# Patient Record
Sex: Male | Born: 1965 | Race: White | Hispanic: No | Marital: Married | Smoking: Former smoker
Health system: Southern US, Community
[De-identification: ages and names within clinical notes are randomized; demographics above are authoritative.]

## PROBLEM LIST (undated history)

## (undated) DIAGNOSIS — B079 Viral wart, unspecified: Secondary | ICD-10-CM

## (undated) DIAGNOSIS — H269 Unspecified cataract: Secondary | ICD-10-CM

## (undated) DIAGNOSIS — G43909 Migraine, unspecified, not intractable, without status migrainosus: Secondary | ICD-10-CM

## (undated) DIAGNOSIS — I493 Ventricular premature depolarization: Secondary | ICD-10-CM

## (undated) HISTORY — DX: Migraine, unspecified, not intractable, without status migrainosus: G43.909

## (undated) HISTORY — DX: Viral wart, unspecified: B07.9

## (undated) HISTORY — DX: Unspecified cataract: H26.9

## (undated) HISTORY — DX: Ventricular premature depolarization: I49.3

## (undated) HISTORY — PX: CATARACT EXTRACTION: SUR2

---

## 2005-08-28 ENCOUNTER — Encounter: Admission: RE | Admit: 2005-08-28 | Discharge: 2005-08-28 | Payer: Self-pay | Admitting: Family Medicine

## 2007-03-04 IMAGING — US US SCROTUM
1 series · 14 of 25 positions shown · non-contrast
Comparison: None.

CLINICAL DATA: Palpable abnormality in the right scrotum at physical
examination. Patient gives history of hydrocele in the past.

SCROTAL ULTRASOUND 08/28/2005:

[Series 1: us scrotum · 0.08mm/px · 14 of 44 slices shown]
[im 1/44]
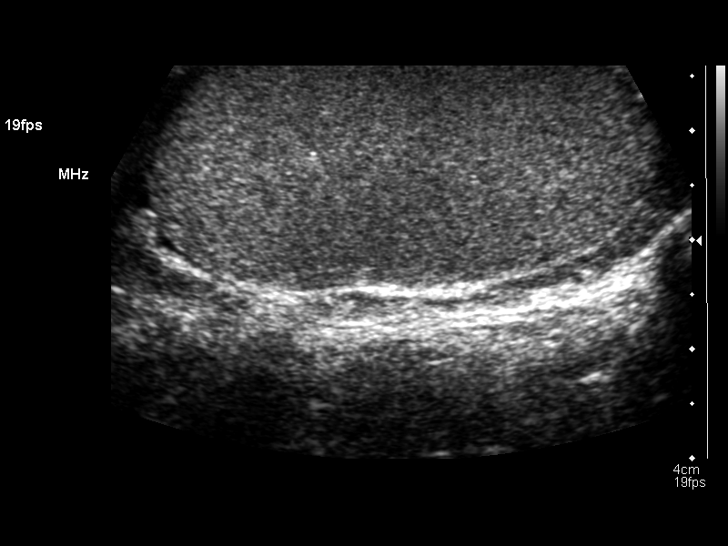
[im 4/44]
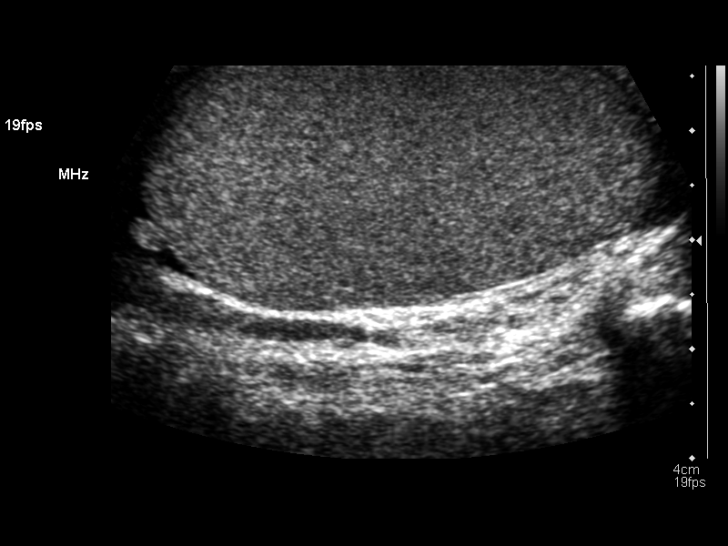
[im 8/44]
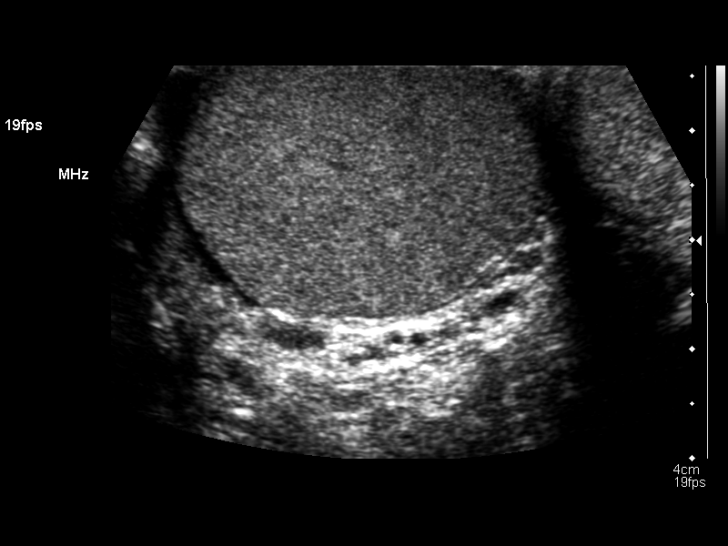
[im 11/44]
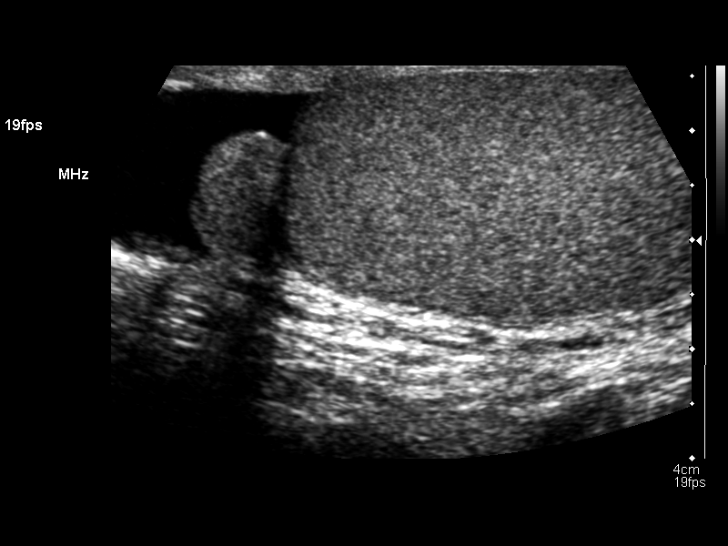
[im 15/44]
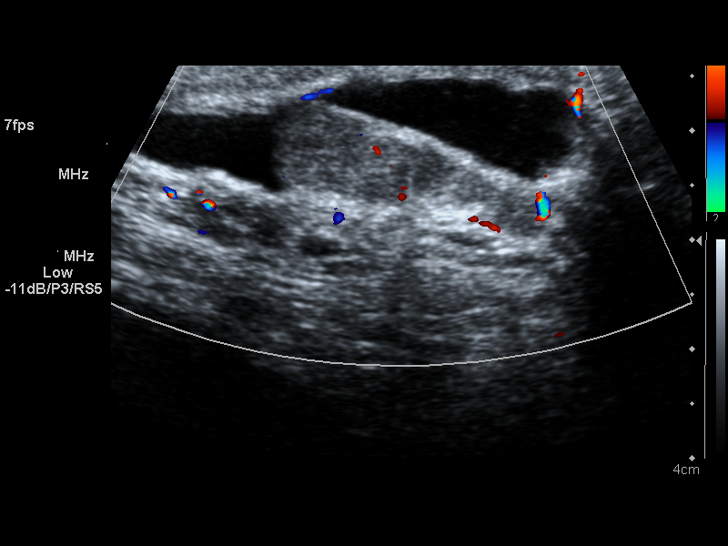
[im 17/44]
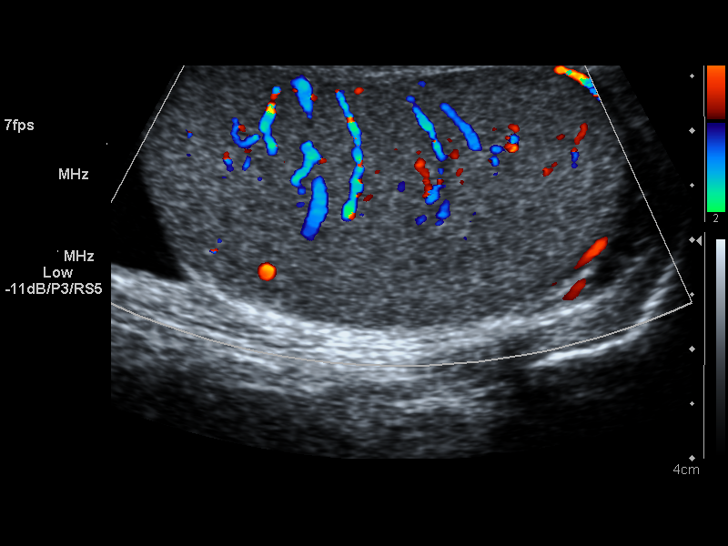
[im 20/44]
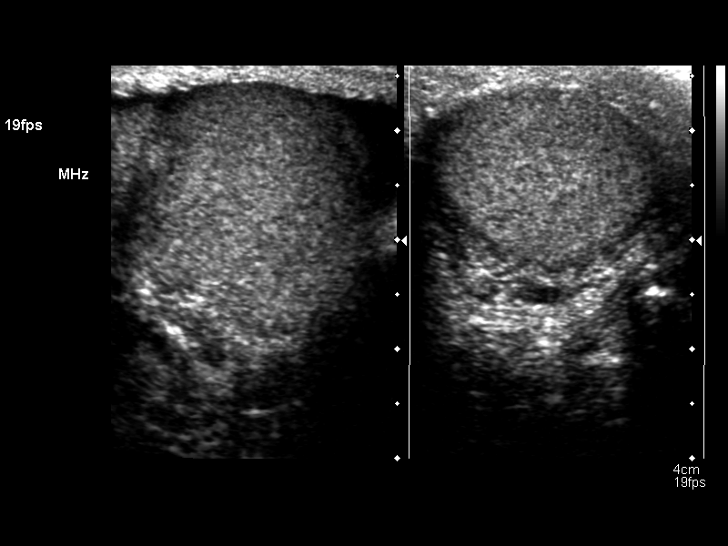
[im 24/44]
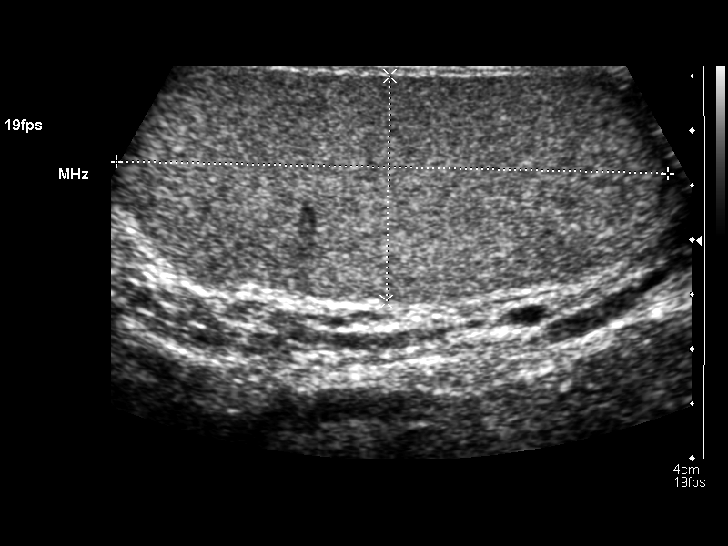
[im 27/44]
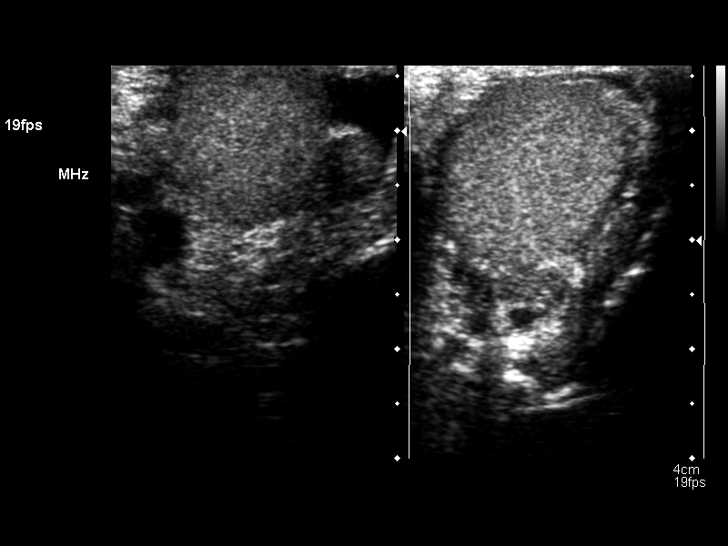
[im 29/44]
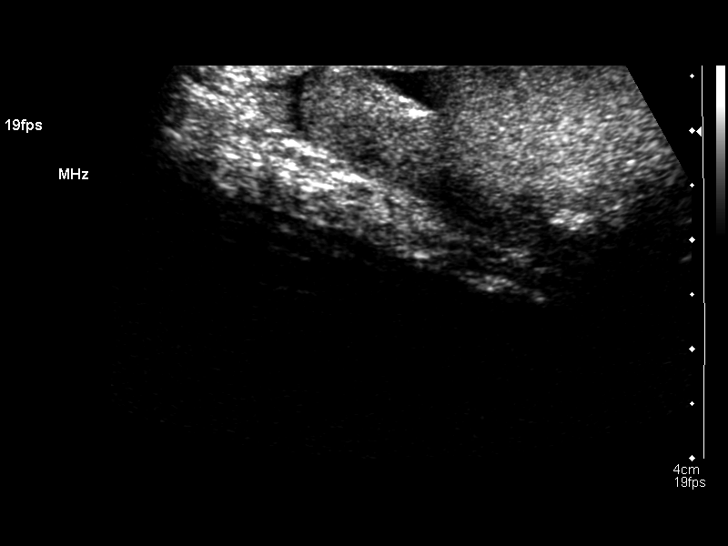
[im 33/44]
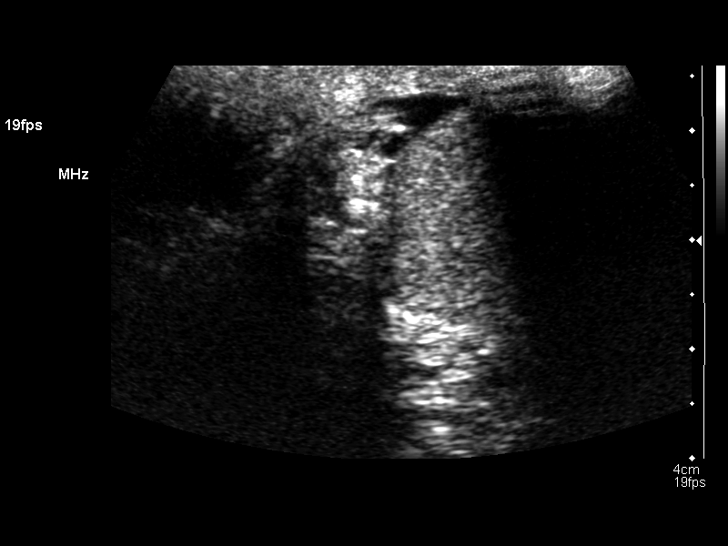
[im 36/44]
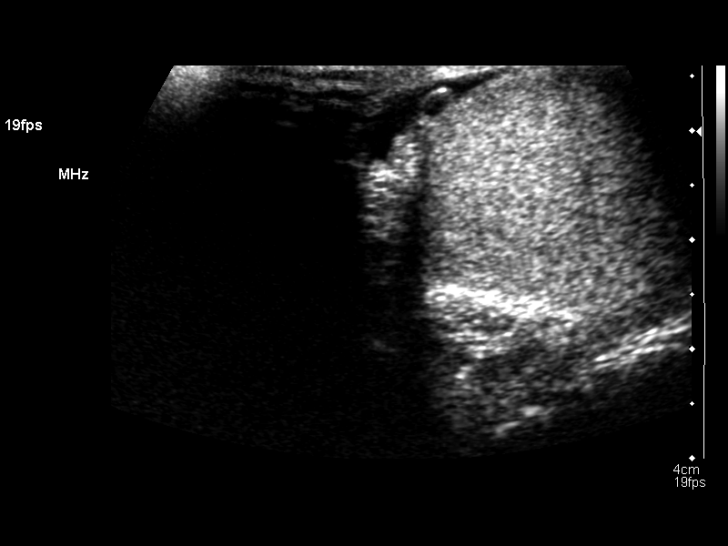
[im 40/44]
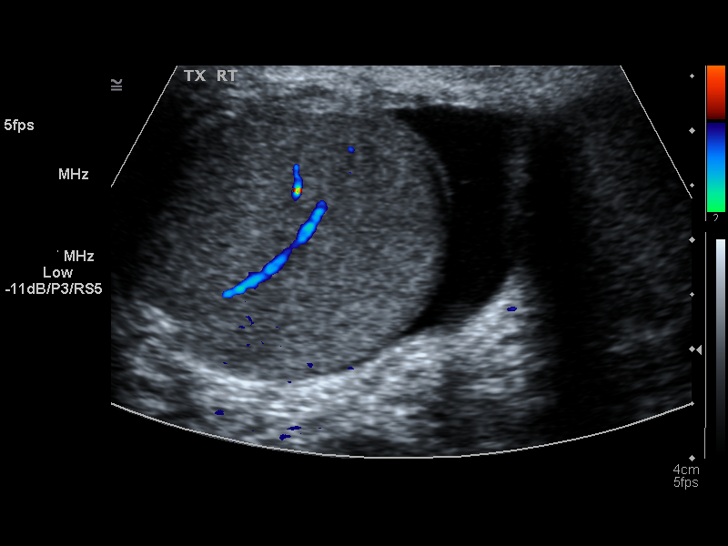
[im 44/44]
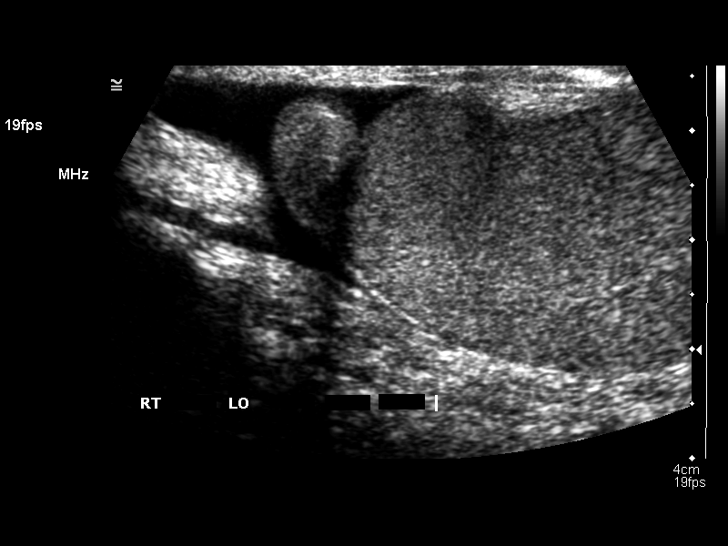

[14 of 25 positions shown; findings below may reference images not displayed]

FINDINGS: Right testicle:  Normal in size with homogeneous echotexture. No focal
abnormalities. 4.8 x 2.2 x 3.4 cm. Color Doppler flow confirmed.

Left testicle:  Normal in size with homogeneous echotexture. No focal
abnormalities. 5.2 x 2.2 x 2.7 cm. Color Doppler flow confirmed.

Right epididymis:  Normal.

Left epididymis:  Approximately 3 mm cyst. Normal otherwise.

Other:  There is a moderate right hydrocele a very small left hydrocele. No
varicoceles are identified. Imaging over the palpable area in the right scrotum
shows the normal epididymis within the hydrocele, and I suspected this is what
is being palpated.
IMPRESSION: 1. Normal appearing testes bilaterally.
2. 3 mm epididymal cyst on the left.
3. Moderate right hydrocele and small left hydrocele.
4. The palpable area in the right scrotum corresponds to the normal epididymis
within the right hydrocele.

## 2011-04-12 ENCOUNTER — Emergency Department (HOSPITAL_COMMUNITY)
Admission: EM | Admit: 2011-04-12 | Discharge: 2011-04-12 | Discharge status: Against Medical Advice | Payer: Self-pay | Attending: Emergency Medicine | Admitting: Emergency Medicine

## 2011-04-12 DIAGNOSIS — R6884 Jaw pain: Secondary | ICD-10-CM | POA: Insufficient documentation

## 2011-04-12 DIAGNOSIS — M25519 Pain in unspecified shoulder: Secondary | ICD-10-CM | POA: Insufficient documentation

## 2011-04-12 DIAGNOSIS — M542 Cervicalgia: Secondary | ICD-10-CM | POA: Insufficient documentation

## 2016-03-01 ENCOUNTER — Encounter: Payer: Self-pay | Admitting: Medical

## 2016-03-01 ENCOUNTER — Ambulatory Visit (INDEPENDENT_AMBULATORY_CARE_PROVIDER_SITE_OTHER): Payer: BLUE CROSS/BLUE SHIELD | Admitting: Medical

## 2016-03-01 VITALS — BP 114/64 | HR 63 | Ht 71.0 in | Wt 154.0 lb

## 2016-03-01 DIAGNOSIS — Z113 Encounter for screening for infections with a predominantly sexual mode of transmission: Secondary | ICD-10-CM

## 2016-03-01 DIAGNOSIS — L989 Disorder of the skin and subcutaneous tissue, unspecified: Secondary | ICD-10-CM | POA: Diagnosis not present

## 2016-03-01 DIAGNOSIS — Z299 Encounter for prophylactic measures, unspecified: Secondary | ICD-10-CM

## 2016-03-01 DIAGNOSIS — Z202 Contact with and (suspected) exposure to infections with a predominantly sexual mode of transmission: Secondary | ICD-10-CM | POA: Diagnosis not present

## 2016-03-01 DIAGNOSIS — Z418 Encounter for other procedures for purposes other than remedying health state: Secondary | ICD-10-CM | POA: Diagnosis not present

## 2016-03-01 LAB — BASIC METABOLIC PANEL
BUN: 25 mg/dL (ref 7–25)
CO2: 26 mmol/L (ref 20–31)
Calcium: 9.6 mg/dL (ref 8.6–10.3)
Chloride: 101 mmol/L (ref 98–110)
Creat: 0.84 mg/dL (ref 0.60–1.35)
Glucose, Bld: 89 mg/dL (ref 65–99)
Potassium: 4.7 mmol/L (ref 3.5–5.3)
Sodium: 139 mmol/L (ref 135–146)

## 2016-03-01 NOTE — Progress Notes (Signed)
Subjective Chief Complaint  Patient presents with  . New Patient (Initial Visit)    get established. wants to discuss truvada.    Here as a new patient.   No prior PCP.  Was using urgent care and [facility] prior.  Wanted to get established with PCP.   Quit smoking a year ago in April.  Starting a 12 week exercise program to work on strength and conditioning.  Has some moles he wants looked at.  A few new ones, and many unchanged for years.     Wants to talk about Truvada.  Been in relationship 24 years with his male partner, got married to him last year.   They both have open relationships with other men.  2 of their friends have HIV but HIV levels undetectable.   They use condoms.  However, they both want to get on Prep to help prevent transmission.  Has a friend in [place] who is a sex worker that encouraged him to get on Truvada.  He has no current symptoms    History reviewed. No pertinent past medical history.  ROS as in subjective  Objective: BP 114/64 mmHg  Pulse 63  Ht 5\' 11"  (1.803 m)  Wt 154 lb (69.854 kg)  BMI 21.49 kg/m2  General appearance: alert, no distress, WD/WN Left preauricular area with roundish flat brown lesion, likely solar lentigo, left hand over dorsal 2nd metacarpal with 3mm round rough lesion, possible AK vs wart, several other raised papule 2-353mm roundish lesions of both hands, several scattered macules in general, freckles throughout   Assessment: Encounter Diagnoses  Name Primary?  . Venereal disease contact Yes  . Need for prophylactic measure   . Screen for STD (sexually transmitted disease)   . Skin lesion     Plan: Discussed Truvada/PreP, proper use, safe sex, condom use, high risk behavior, possible complications of Truvada, follow up required to have treatment.   Labs today, and if negative, begin Prep.  Must f/u within 6mo for routine f/u and labs.  He agrees with plan, signed the [employer] Truvada agreement form.  We reviewed the  [employer] pre treatment checklist.     Skin lesions - referral to dermatology for routine skin surveillance and eval of the areas of concern  Advised he return soon for physical, preventative recommendations  Renae Fickleaul was seen today for new patient (initial visit).  Diagnoses and all orders for this visit:  Venereal disease contact -     HIV antibody -     RPR -     GC/Chlamydia Probe Amp -     Hepatitis C antibody -     Hepatitis B surface antibody -     Hepatitis B surface antigen -     Basic metabolic panel  Need for prophylactic measure -     HIV antibody -     RPR -     GC/Chlamydia Probe Amp -     Hepatitis C antibody -     Hepatitis B surface antibody -     Hepatitis B surface antigen -     Basic metabolic panel  Screen for STD (sexually transmitted disease) -     HIV antibody -     RPR -     GC/Chlamydia Probe Amp -     Hepatitis C antibody -     Hepatitis B surface antibody -     Hepatitis B surface antigen -     Basic metabolic panel  Skin lesion -  Ambulatory referral to Dermatology

## 2016-03-02 ENCOUNTER — Other Ambulatory Visit: Payer: Self-pay | Admitting: Medical

## 2016-03-02 LAB — RPR

## 2016-03-02 LAB — GC/CHLAMYDIA PROBE AMP
CT Probe RNA: NOT DETECTED
GC PROBE AMP APTIMA: NOT DETECTED

## 2016-03-02 LAB — HEPATITIS C ANTIBODY: HCV Ab: NEGATIVE

## 2016-03-02 LAB — HIV ANTIBODY (ROUTINE TESTING W REFLEX): HIV 1&2 Ab, 4th Generation: NONREACTIVE

## 2016-03-02 LAB — HEPATITIS B SURFACE ANTIBODY, QUANTITATIVE: HEPATITIS B-POST: 0.1 m[IU]/mL

## 2016-03-02 LAB — HEPATITIS B SURFACE ANTIGEN: Hepatitis B Surface Ag: NEGATIVE

## 2016-03-02 MED ORDER — EMTRICITABINE-TENOFOVIR DF 200-300 MG PO TABS: 1.0000 | ORAL_TABLET | Freq: Every day | ORAL | Status: DC

## 2016-03-03 ENCOUNTER — Other Ambulatory Visit: Payer: Self-pay | Admitting: *Deleted

## 2016-03-03 MED ORDER — EMTRICITABINE-TENOFOVIR DF 200-300 MG PO TABS: 1.0000 | ORAL_TABLET | Freq: Every day | ORAL | Status: DC

## 2016-05-02 ENCOUNTER — Ambulatory Visit (INDEPENDENT_AMBULATORY_CARE_PROVIDER_SITE_OTHER): Payer: BLUE CROSS/BLUE SHIELD | Admitting: Medical

## 2016-05-02 ENCOUNTER — Encounter: Payer: Self-pay | Admitting: Medical

## 2016-05-02 VITALS — BP 120/80 | HR 68 | Wt 163.0 lb

## 2016-05-02 DIAGNOSIS — M659 Synovitis and tenosynovitis, unspecified: Secondary | ICD-10-CM | POA: Diagnosis not present

## 2016-05-02 DIAGNOSIS — IMO0002 Reserved for concepts with insufficient information to code with codable children: Secondary | ICD-10-CM

## 2016-05-02 NOTE — Progress Notes (Addendum)
Subjective: Chief Complaint  Patient presents with  . rt wrist pain    said that over the last 10 weeks it has been coming and going. flares up with certain movements. was hurting so bad yesterday he could not do activites and would "take his breath away"   He reports right wrist issues x last 10 weeks.  Sometimes gets sharp stabbing pains either in volar wrist or deep in elbow.   yesterday pulling up his pants, had sudden pain that made him gasps due the the right arm pain.  Is right handed.  Gets symptoms at least weekly.  Been in a 12 week training program, and has noticed this.   Denies injury, trauma.   He notes some aches in the right forearm in the past, but not ongoing problems quite like current symptoms.   On the computer a lot, is a Paediatric nursebarber as well.  No numbness, but gets some strange sensations in fingers, but attributes this to dog attack in 2011.  Using no medications, ice or other treatment for this.  Denies swelling of the wrist or fingers.  No other aggravating or relieving factors. No other complaint.  ROS as in subjective  Objective: BP 120/80 mmHg  Pulse 68  Wt 163 lb (73.936 kg)  Gen:wd, wn, nad Skin: unremarkable Right wrist and arm nontender to palpation, mild pain in volar wrist and anterior elbow region with flexion of forearm, no swelling, no deformity, but ROM of elbow wrist and fingers normal Arms neurovascularly intact   Assessment: Encounter Diagnosis  Name Primary?  . Tendonitis of elbow or forearm Yes    Plan: Discussed differential.   Begin OTC Aleve, ice 20 minutes BID for the next few days, begin OTC reinforced wrist splint, adequately rest the arm as discussed, cut back on the arm strength training for now.   If not resolved within 2 weeks call or recheck.  Once the symptom resolve, will need to start back more gradual with the exercise regimen he is doing.

## 2016-05-22 ENCOUNTER — Other Ambulatory Visit: Payer: Self-pay | Admitting: Medical

## 2016-05-22 NOTE — Telephone Encounter (Signed)
Is this ok to refill?  

## 2016-05-30 ENCOUNTER — Ambulatory Visit (INDEPENDENT_AMBULATORY_CARE_PROVIDER_SITE_OTHER): Payer: BLUE CROSS/BLUE SHIELD | Admitting: Medical

## 2016-05-30 ENCOUNTER — Encounter: Payer: Self-pay | Admitting: Medical

## 2016-05-30 VITALS — BP 112/70 | HR 65 | Wt 166.0 lb

## 2016-05-30 DIAGNOSIS — Z299 Encounter for prophylactic measures, unspecified: Secondary | ICD-10-CM | POA: Diagnosis not present

## 2016-05-30 DIAGNOSIS — Z113 Encounter for screening for infections with a predominantly sexual mode of transmission: Secondary | ICD-10-CM | POA: Diagnosis not present

## 2016-05-30 DIAGNOSIS — Z125 Encounter for screening for malignant neoplasm of prostate: Secondary | ICD-10-CM | POA: Insufficient documentation

## 2016-05-30 DIAGNOSIS — Z23 Encounter for immunization: Secondary | ICD-10-CM | POA: Diagnosis not present

## 2016-05-30 NOTE — Progress Notes (Signed)
Subjective:     Patient ID: Curtis Walsh, male   DOB: Apr 03, 1966, 50 y.o.   MRN: [id number]  HPI Chief Complaint  Patient presents with  . Follow-up    states all is well   Here for f/u on PreP.  Started Prep 60mo ago, compliant, taking it daily, had zero copay since [employer] covered copay.  Has no side effects or problems with the medication.  No new sexual partners.  Is married, male partner.  Uses condoms.  No new c/o.  Since last visit saw dermatology and they noted no worrisome findings, no biopsies taken.    Review of Systems     Objective:   Physical Exam  Gen: wd, wn, nad otherwise not examined     Assessment:     Encounter Diagnoses  Name Primary?  . Prophylactic measure Yes  . Screen for STD (sexually transmitted disease)   . Need for hepatitis B vaccination        Plan:     HIV lab today for PreP f/u.  C/t PreP/truvada prophylaxis, discussed safe sex, condom use, routine f/u.    Counseled on the Hepatitis B virus vaccine.  Vaccine information sheet given.  Hepatitis B vaccine given after consent obtained.  Patient was advised to return in 2 months for Hep B #2, and in 6 months for Hep B #3.    F/u 44mo on PreP and physical.

## 2016-05-31 ENCOUNTER — Telehealth: Payer: Self-pay | Admitting: Medical

## 2016-05-31 ENCOUNTER — Other Ambulatory Visit: Payer: Self-pay | Admitting: Medical

## 2016-05-31 LAB — HIV ANTIBODY (ROUTINE TESTING W REFLEX): HIV 1&2 Ab, 4th Generation: NONREACTIVE

## 2016-05-31 MED ORDER — EMTRICITABINE-TENOFOVIR DF 200-300 MG PO TABS: 1.0000 | ORAL_TABLET | Freq: Every day | ORAL | 0 refills | Status: DC

## 2016-05-31 NOTE — Telephone Encounter (Signed)
That is the correct pharmacy it gets shipped to the pt

## 2016-05-31 NOTE — Telephone Encounter (Signed)
Please check which pharmacy he wants me to use?  I may have sent it to the wrong pharmacy. The one listed [place] special[facility] [place].  If that is not correct, cancel and send to correct pharmacy.

## 2016-07-25 ENCOUNTER — Encounter: Payer: Self-pay | Admitting: Medical

## 2016-08-28 ENCOUNTER — Other Ambulatory Visit: Payer: Self-pay | Admitting: Medical

## 2016-08-28 NOTE — Telephone Encounter (Signed)
Ok to refill? Pt has appt 08/30/2016

## 2016-08-30 ENCOUNTER — Ambulatory Visit (INDEPENDENT_AMBULATORY_CARE_PROVIDER_SITE_OTHER): Payer: BLUE CROSS/BLUE SHIELD | Admitting: Medical

## 2016-08-30 ENCOUNTER — Encounter: Payer: Self-pay | Admitting: Medical

## 2016-08-30 VITALS — BP 112/60 | HR 63 | Ht 71.0 in | Wt 167.0 lb

## 2016-08-30 DIAGNOSIS — Z0000 Encounter for general adult medical examination without abnormal findings: Secondary | ICD-10-CM | POA: Diagnosis not present

## 2016-08-30 DIAGNOSIS — G43909 Migraine, unspecified, not intractable, without status migrainosus: Secondary | ICD-10-CM | POA: Insufficient documentation

## 2016-08-30 DIAGNOSIS — B079 Viral wart, unspecified: Secondary | ICD-10-CM | POA: Diagnosis not present

## 2016-08-30 DIAGNOSIS — Z2821 Immunization not carried out because of patient refusal: Secondary | ICD-10-CM | POA: Insufficient documentation

## 2016-08-30 DIAGNOSIS — N529 Male erectile dysfunction, unspecified: Secondary | ICD-10-CM | POA: Insufficient documentation

## 2016-08-30 DIAGNOSIS — G43809 Other migraine, not intractable, without status migrainosus: Secondary | ICD-10-CM

## 2016-08-30 DIAGNOSIS — Z23 Encounter for immunization: Secondary | ICD-10-CM

## 2016-08-30 DIAGNOSIS — Z125 Encounter for screening for malignant neoplasm of prostate: Secondary | ICD-10-CM

## 2016-08-30 DIAGNOSIS — Z113 Encounter for screening for infections with a predominantly sexual mode of transmission: Secondary | ICD-10-CM

## 2016-08-30 DIAGNOSIS — Z299 Encounter for prophylactic measures, unspecified: Secondary | ICD-10-CM

## 2016-08-30 LAB — PSA: PSA: 0.9 ng/mL (ref <=4.0)

## 2016-08-30 LAB — CBC
HCT: 44.3 % (ref 38.5–50.0)
Hemoglobin: 15.1 g/dL (ref 13.2–17.1)
MCH: 32.1 pg (ref 27.0–33.0)
MCHC: 34.1 g/dL (ref 32.0–36.0)
MCV: 94.1 fL (ref 80.0–100.0)
MPV: 11.5 fL (ref 7.5–12.5)
Platelets: 270 10*3/uL (ref 140–400)
RBC: 4.71 MIL/uL (ref 4.20–5.80)
RDW: 13.2 % (ref 11.0–15.0)
WBC: 10.3 10*3/uL (ref 4.0–10.5)

## 2016-08-30 LAB — POCT URINALYSIS DIPSTICK
BILIRUBIN UA: NEGATIVE
Glucose, UA: NEGATIVE
Ketones, UA: NEGATIVE
Leukocytes, UA: NEGATIVE
Nitrite, UA: NEGATIVE
Protein, UA: NEGATIVE
RBC UA: NEGATIVE
Spec Grav, UA: 1.02
Urobilinogen, UA: NEGATIVE
pH, UA: 6.5

## 2016-08-30 LAB — COMPREHENSIVE METABOLIC PANEL
ALT: 22 U/L (ref 9–46)
AST: 20 U/L (ref 10–40)
Albumin: 4.3 g/dL (ref 3.6–5.1)
Alkaline Phosphatase: 73 U/L (ref 40–115)
BUN: 20 mg/dL (ref 7–25)
CHLORIDE: 105 mmol/L (ref 98–110)
CO2: 27 mmol/L (ref 20–31)
Calcium: 9 mg/dL (ref 8.6–10.3)
Creat: 1 mg/dL (ref 0.60–1.35)
Glucose, Bld: 96 mg/dL (ref 65–99)
Potassium: 4.5 mmol/L (ref 3.5–5.3)
Sodium: 139 mmol/L (ref 135–146)
Total Bilirubin: 0.3 mg/dL (ref 0.2–1.2)
Total Protein: 7 g/dL (ref 6.1–8.1)

## 2016-08-30 LAB — LIPID PANEL
Cholesterol: 146 mg/dL (ref 125–200)
HDL: 35 mg/dL — ABNORMAL LOW (ref >=40)
LDL Cholesterol: 95 mg/dL (ref <130)
Total CHOL/HDL Ratio: 4.2 Ratio (ref <=5.0)
Triglycerides: 82 mg/dL (ref <150)
VLDL: 16 mg/dL (ref <30)

## 2016-08-30 NOTE — Patient Instructions (Signed)

## 2016-08-30 NOTE — Addendum Note (Signed)
Addended by: Minette HeadlandBENFIELD, Tiani Stanbery L on: 08/30/2016 09:46 AM   Modules accepted: Orders

## 2016-08-30 NOTE — Progress Notes (Signed)
Subjective:   HPI  Curtis Walsh is a 50 y.o. male who presents for a complete physical. Chief Complaint  Patient presents with  . Annual Exam    here for second Hep B. Declines flu vaccine. Due for tdap as well. Here for 90 day med f/u as well. fasting. no concerns.     Concerns: Wants testosterone checked  Here for f/u on PrEP.    Reviewed their medical, surgical, family, social, medication, and allergy history and updated chart as appropriate.  Past Medical History:  Diagnosis Date  . Cataract   . Migraines   . PVC (premature ventricular contraction)   . Warts     Past Surgical History:  Procedure Laterality Date  . CATARACT EXTRACTION      Social History   Social History  . Marital status: Married    Spouse name: N/A  . Number of children: N/A  . Years of education: N/A   Occupational History  . Not on file.   Social History Main Topics  . Smoking status: Former Games developer  . Smokeless tobacco: Never Used  . Alcohol use No  . Drug use:     Types: Marijuana  . Sexual activity: Yes   Other Topics Concern  . Not on file   Social History Narrative   Lives with husband and 4 dogs, works as Social worker.   Exercise - weight train 4 times per week, yoga and cardio.   As of 08/2016    Family History  Problem Relation Age of Onset  . Endometriosis Mother   . Clotting disorder Mother   . Thyroid nodules Mother   . Cancer Maternal Grandmother     lung  . Diabetes Maternal Grandfather   . Dementia Maternal Grandfather   . Dementia Paternal Grandmother   . Cancer Paternal Grandfather     larynx  . Hypertension Neg Hx   . Heart disease Neg Hx   . Hyperlipidemia Neg Hx   . Stroke Neg Hx      Current Outpatient Prescriptions:  .  TRUVADA 200-300 MG tablet, TAKE ONE TABLET BY MOUTH ONCE DAILY WITH OR WITHOUT FOOD. STORE IN ORIGINAL CONTAINER AT ROOM TEMPERATURE., Disp: 90 tablet, Rfl: 0  No Known Allergies   Review of Systems Constitutional: -fever,  -chills, -sweats, -unexpected weight change, -decreased appetite, -fatigue Allergy: -sneezing, -itching, -congestion Dermatology: -changing moles, --rash, -lumps ENT: -runny nose, -ear pain, -sore throat, -hoarseness, -sinus pain, -teeth pain, - ringing in ears, -hearing loss, -nosebleeds Cardiology: -chest pain, -palpitations, -swelling, -difficulty breathing when lying flat, -waking up short of breath Respiratory: -cough, -shortness of breath, -difficulty breathing with exercise or exertion, -wheezing, -coughing up blood Gastroenterology: -abdominal pain, -nausea, -vomiting, -diarrhea, -constipation, -blood in stool, -changes in bowel movement, -difficulty swallowing or eating Hematology: -bleeding, -bruising  Musculoskeletal: -joint aches, -muscle aches, -joint swelling, -back pain, -neck pain, -cramping, -changes in gait Ophthalmology: denies vision changes, eye redness, itching, discharge Urology: -burning with urination, -difficulty urinating, -blood in urine, -urinary frequency, -urgency, -incontinence Neurology: -headache, -weakness, -tingling, -numbness, -memory loss, -falls, -dizziness Psychology: -depressed mood, -agitation, -sleep problems     Objective:   Physical Exam  BP 112/60   Pulse 63   Ht 5\' 11"  (1.803 m)   Wt 167 lb (75.8 kg)   SpO2 99%   BMI 23.29 kg/m   General appearance: alert, no distress, WD/WN, white male Skin:few scattered macules, no worrisome lesions HEENT: normocephalic, conjunctiva/corneas normal, sclerae anicteric, PERRLA, EOMi, nares patent, no discharge or  erythema, pharynx normal Oral cavity: MMM, tongue normal, teeth with lots of stain, otherwise in good repair Neck: supple, no lymphadenopathy, no thyromegaly, no masses, normal ROM, no bruits Chest: non tender, normal shape and expansion Heart: RRR, normal S1, S2, no murmurs Lungs: CTA bilaterally, no wheezes, rhonchi, or rales Abdomen: +bs, soft, non tender, non distended, no masses, no  hepatomegaly, no splenomegaly, no bruits Back: non tender, normal ROM, no scoliosis Musculoskeletal: upper extremities non tender, no obvious deformity, normal ROM throughout, lower extremities non tender, no obvious deformity, normal ROM throughout Extremities: no edema, no cyanosis, no clubbing Pulses: 2+ symmetric, upper and lower extremities, normal cap refill Neurological: alert, oriented x 3, CN2-12 intact, strength normal upper extremities and lower extremities, sensation normal throughout, DTRs 2+ throughout, no cerebellar signs, gait normal Psychiatric: normal affect, behavior normal, pleasant  GU: normal male external genitalia, circumcised, right testicle slightly enlarged compared to left (chronic per patient), non tender, no masses, no hernia, no lymphadenopathy Rectal: anus normal tone, prostate WNL, no nodules   Assessment and Plan :     Encounter Diagnoses  Name Primary?  . Encounter for health maintenance examination in adult Yes  . Prophylactic measure   . Screen for STD (sexually transmitted disease)   . Other migraine without status migrainosus, not intractable   . Need for Tdap vaccination   . Need for hepatitis B vaccination   . Screening for prostate cancer   . Viral warts, unspecified type   . Erectile dysfunction, unspecified erectile dysfunction type   . Need for prophylactic vaccination and inoculation against influenza     Physical exam - discussed healthy lifestyle, diet, exercise, preventative care, vaccinations, and addressed their concerns.   Routine labs today Discussed pros/cons prostate testing/screening See your eye doctor yearly for routine vision care. See your dentist yearly for routine dental care including hygiene visits twice yearly.  Prophylactic measure - discussed safe sex, condom use, routine f/u for PrEP.  HIV lab today  Counseled on the Tdap (tetanus, diptheria, and acellular pertussis) vaccine.  Vaccine information sheet given. Tdap  vaccine given after consent obtained.  Counseled on the Hepatitis B virus vaccine.  Vaccine information sheet given.  Hepatitis B vaccine given after consent obtained.  Patient was advised to return in 6 months for Hep B #3.    Counseled on the influenza virus vaccine.  Vaccine information sheet given.  Influenza vaccine given after consent obtained.  Follow-up pending labs  Renae Fickleaul was seen today for annual exam.  Diagnoses and all orders for this visit:  Encounter for health maintenance examination in adult -     Comprehensive metabolic panel -     CBC -     Lipid panel -     PSA -     HIV antibody -     Urinalysis Dipstick -     Visual acuity screening -     Testosterone  Prophylactic measure  Screen for STD (sexually transmitted disease) -     HIV antibody  Other migraine without status migrainosus, not intractable  Need for Tdap vaccination  Need for hepatitis B vaccination  Screening for prostate cancer -     PSA  Viral warts, unspecified type  Erectile dysfunction, unspecified erectile dysfunction type -     Testosterone  Need for prophylactic vaccination and inoculation against influenza

## 2016-08-31 LAB — TESTOSTERONE: Testosterone: 507 ng/dL (ref 250–827)

## 2016-08-31 LAB — HIV ANTIBODY (ROUTINE TESTING W REFLEX): HIV 1&2 Ab, 4th Generation: NONREACTIVE

## 2016-11-08 ENCOUNTER — Telehealth: Payer: Self-pay | Admitting: Family Medicine

## 2016-11-29 NOTE — Telephone Encounter (Signed)
error 

## 2016-11-30 ENCOUNTER — Ambulatory Visit (INDEPENDENT_AMBULATORY_CARE_PROVIDER_SITE_OTHER): Payer: BLUE CROSS/BLUE SHIELD | Admitting: Medical

## 2016-11-30 ENCOUNTER — Encounter: Payer: Self-pay | Admitting: Medical

## 2016-11-30 VITALS — BP 118/72 | HR 70 | Wt 165.8 lb

## 2016-11-30 DIAGNOSIS — M542 Cervicalgia: Secondary | ICD-10-CM | POA: Diagnosis not present

## 2016-11-30 DIAGNOSIS — M62838 Other muscle spasm: Secondary | ICD-10-CM

## 2016-11-30 DIAGNOSIS — G8929 Other chronic pain: Secondary | ICD-10-CM

## 2016-11-30 DIAGNOSIS — Z299 Encounter for prophylactic measures, unspecified: Secondary | ICD-10-CM | POA: Diagnosis not present

## 2016-11-30 DIAGNOSIS — M549 Dorsalgia, unspecified: Secondary | ICD-10-CM | POA: Diagnosis not present

## 2016-11-30 DIAGNOSIS — Z113 Encounter for screening for infections with a predominantly sexual mode of transmission: Secondary | ICD-10-CM | POA: Diagnosis not present

## 2016-11-30 MED ORDER — CYCLOBENZAPRINE HCL 10 MG PO TABS: ORAL_TABLET | ORAL | 0 refills | Status: DC

## 2016-11-30 MED ORDER — EMTRICITABINE-TENOFOVIR DF 200-300 MG PO TABS: ORAL_TABLET | ORAL | 0 refills | Status: DC
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
## 2016-12-01 LAB — HIV ANTIBODY (ROUTINE TESTING W REFLEX): HIV: NONREACTIVE

## 2016-12-20 ENCOUNTER — Encounter: Payer: Self-pay | Admitting: Medical

## 2017-02-26 ENCOUNTER — Other Ambulatory Visit: Payer: Self-pay | Admitting: Medical

## 2017-03-28 ENCOUNTER — Ambulatory Visit (INDEPENDENT_AMBULATORY_CARE_PROVIDER_SITE_OTHER): Payer: BLUE CROSS/BLUE SHIELD | Admitting: Medical

## 2017-03-28 ENCOUNTER — Encounter: Payer: Self-pay | Admitting: Medical

## 2017-03-28 VITALS — BP 114/70 | HR 82 | Temp 98.0°F | Wt 162.6 lb

## 2017-03-28 DIAGNOSIS — J988 Other specified respiratory disorders: Secondary | ICD-10-CM | POA: Diagnosis not present

## 2017-03-28 DIAGNOSIS — R059 Cough, unspecified: Secondary | ICD-10-CM

## 2017-03-28 DIAGNOSIS — R05 Cough: Secondary | ICD-10-CM | POA: Diagnosis not present

## 2017-03-28 NOTE — Progress Notes (Signed)
Subjective:  Curtis Walsh is a 51 y.o. male who presents for illness.  Sunday 4 days ago felt irritation in throat, next day cough, congestion, but yesterday felt horrible.  Woke up this morning with cramps and diarrhea.   Has felt feverish.   Had bizarre dreams last night.   still has sore throat, cough, runny nose.   Mostly head congestion.   No body aches, no chills, no ear pain.   Using alka seltzer day time.  No sick contacts.   He does have hx/o pneumonia a few times in the past.  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  He is currently working at [employer] for now.   No other aggravating or relieving factors.  No other c/o.  The following portions of the patient's history were reviewed and updated as appropriate: allergies, current medications, past family history, past medical history, past social history, past surgical history and problem list.  ROS as in subjective  Past Medical History:  Diagnosis Date  . Cataract   . Migraines   . PVC (premature ventricular contraction)   . Warts      Objective: BP 114/70   Pulse 82   Temp 98 F (36.7 C)   Wt 162 lb 9.6 oz (73.8 kg)   SpO2 99%   BMI 22.68 kg/m   General appearance: Alert, WD/WN, no distress,somewhat ill appearing                             Skin: warm, no rash, no diaphoresis                           Head: no sinus tenderness                            Eyes: conjunctiva normal, corneas clear, PERRLA                            Ears: pearly TMs, external ear canals normal                          Nose: septum midline, turbinates swollen, with erythema and no discharge             Mouth/throat: MMM, tongue normal, mild pharyngeal erythema                           Neck: supple, no adenopathy, no thyromegaly, nontender                          Heart: RRR, normal S1, S2, no murmurs                         Lungs: lower fields with somewhat decreased breath sounds, no rhonchi, no  wheezes, faint crackles in bilat lung bases, but no dullness                Extremities: no edema, nontender      Assessment: Encounter Diagnoses  Name Primary?  . Cough Yes  . Respiratory tract infection      Plan:  discussed rest, hydration, supportive care, can c/t OTC remedy.  Currently exam and symptoms may still suggest viral URI, but if any fever over 101, wheezing, productive cough or if worsening in the next 48 hours call back.  He decline CXR at this time.   Vitals stable.

## 2017-03-30 ENCOUNTER — Telehealth: Payer: Self-pay | Admitting: Medical

## 2017-03-30 ENCOUNTER — Other Ambulatory Visit: Payer: Self-pay | Admitting: Medical

## 2017-03-30 ENCOUNTER — Ambulatory Visit: Payer: BLUE CROSS/BLUE SHIELD | Admitting: Medical

## 2017-03-30 ENCOUNTER — Encounter: Payer: Self-pay | Admitting: Medical

## 2017-03-30 MED ORDER — ALBUTEROL SULFATE HFA 108 (90 BASE) MCG/ACT IN AERS: 2.0000 | INHALATION_SPRAY | Freq: Four times a day (QID) | RESPIRATORY_TRACT | 0 refills | Status: DC | PRN

## 2017-03-30 MED ORDER — LEVOFLOXACIN 500 MG PO TABS: 500.0000 mg | ORAL_TABLET | Freq: Every day | ORAL | 0 refills | Status: AC

## 2017-03-30 NOTE — Telephone Encounter (Signed)
Patient called back, only slightly better, feeling winded, cough, malaise, body aches, can't work today.  I called out antibiotic and albuterol, gave note for work for today

## 2020-11-25 ENCOUNTER — Other Ambulatory Visit: Payer: Self-pay

## 2020-11-25 ENCOUNTER — Encounter: Payer: Self-pay | Admitting: Medical

## 2020-11-25 ENCOUNTER — Ambulatory Visit (HOSPITAL_COMMUNITY)
Admission: RE | Admit: 2020-11-25 | Discharge: 2020-11-25 | Disposition: A | Discharge status: Home / Self Care | Payer: Managed Care, Other (non HMO) | Source: Ambulatory Visit | Attending: Medical | Admitting: Medical

## 2020-11-25 ENCOUNTER — Telehealth: Payer: Managed Care, Other (non HMO) | Admitting: Medical

## 2020-11-25 VITALS — Ht 71.0 in | Wt 174.0 lb

## 2020-11-25 DIAGNOSIS — R5383 Other fatigue: Secondary | ICD-10-CM | POA: Diagnosis not present

## 2020-11-25 DIAGNOSIS — R059 Cough, unspecified: Secondary | ICD-10-CM | POA: Insufficient documentation

## 2020-11-25 DIAGNOSIS — R0602 Shortness of breath: Secondary | ICD-10-CM | POA: Insufficient documentation

## 2020-11-25 DIAGNOSIS — Z87891 Personal history of nicotine dependence: Secondary | ICD-10-CM | POA: Insufficient documentation

## 2020-11-25 DIAGNOSIS — U071 COVID-19: Secondary | ICD-10-CM | POA: Diagnosis not present

## 2020-11-25 MED ORDER — EMERGEN-C IMMUNE PLUS PO PACK: 1.0000 | PACK | Freq: Two times a day (BID) | ORAL | 0 refills | Status: DC

## 2020-11-25 MED ORDER — FLUTICASONE-SALMETEROL 100-50 MCG/DOSE IN AEPB: 1.0000 | INHALATION_SPRAY | Freq: Two times a day (BID) | RESPIRATORY_TRACT | 0 refills | Status: DC

## 2020-11-25 NOTE — Progress Notes (Signed)
Subjective:     Patient ID: Curtis Walsh, male   DOB: [birth year], 55 y.o.   MRN: [id number]  This visit type was conducted due to national recommendations for restrictions regarding the COVID-19 Pandemic (e.g. social distancing) in an effort to limit this patient's exposure and mitigate transmission in our community.  Due to their co-morbid illnesses, this patient is at least at moderate risk for complications without adequate follow up.  This format is felt to be most appropriate for this patient at this time.    Documentation for virtual audio and video telecommunications through [place] encounter:  The patient was located at home. The provider was located in the office. The patient did consent to this visit and is aware of possible charges through their insurance for this visit.  The other persons participating in this telemedicine service were none. Time spent on call was 20 minutes and in review of previous records 20 minutes total.  This virtual service is not related to other E/M service within previous 7 days.   HPI Chief Complaint  Patient presents with  . Covid Positive    Symptoms started 11/10/20. Still having chest and nasal congestion and fatigue    Tested 11/17/20 covid + through [facility].  Started with symptoms 11/10/20.  Started with cramping in belly but the next days nausea, diarrhea.  The following day felt fatigued but came home from work early on that Friday. By the next Monday, feverish, dry cough, very tired and fatigue, sweats.  By end of the week more GI related symptoms. This week still having hard time with congestion, fatigue.  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  Currently has fatigue ,SOB, some runny nose, chest congestion, cough remained consistent, 50% productive, 50% dry, clear discharge.  No longer having fever.  No vomiting, but some belly cramping.  No severe headache, but had  headache last week.    Currently using nothing.     Has hx/o pneumonia but not in recently years.  Last time 2018.   He had the covid vaccine in March and April 2021, but no booster.   former smoker.   Past Medical History:  Diagnosis Date  . Cataract   . Migraines   . PVC (premature ventricular contraction)   . Warts     Review of Systems As in subjective    Objective:   Physical Exam Due to coronavirus pandemic stay at home measures, patient visit was virtual and they were not examined in person.   Ht 5\' 11"  (1.803 m)   Wt 174 lb (78.9 kg)   BMI 24.27 kg/m   General: Well-developed well-nourished no acute distress No obvious shortness of breath or wheezing Answers questions in complete sentences     Assessment:     Encounter Diagnoses  Name Primary?  . COVID-19 virus infection Yes  . Cough   . SOB (shortness of breath)   . Fatigue, unspecified type   . Former smoker        Plan:     We discussed his recent COVID infection and ongoing shortness of breath at this point.  We will send him for an x-ray today  Begin medications below for the next week or 2.  Discussed risk and benefits of medicine, proper use of medicine.  We discussed general recommendations, and the fact that it could take weeks for the fatigue to gradually resolve   General recommendations if you have  respiratory symptoms: We recommend you rest, hydrate well with water and clear fluids throughout the day such as water, soup broth, ice chips, or possibly Pedialyte or G2 no sugar gatorade.   You can use Tylenol over the counter for pain or fever every 4 - 6 hours You can use over the counter Delsym or mucinex DM for cough unless your provider prescribed a cough medication already. You can use over the counter Emetrol over the counter for nausea.    Consider EmergenC Immune plus vitamin pack over the counter which contains extra vitamin C, vitamin D, and zinc.  Covid symptoms such as  fatigue and cough can linger over 2 weeks, even after the initial fever, aches, chills, and other initial symptoms.   Dhruva was seen today for covid positive.  Diagnoses and all orders for this visit:  COVID-19 virus infection -     DG Chest 2 View; Future  Cough -     DG Chest 2 View; Future  SOB (shortness of breath) -     DG Chest 2 View; Future  Fatigue, unspecified type  Former smoker  Other orders -     Multiple Vitamins-Minerals (EMERGEN-C IMMUNE PLUS) PACK; Take 1 tablet by mouth 2 (two) times daily. -     Fluticasone-Salmeterol (ADVAIR) 100-50 MCG/DOSE AEPB; Inhale 1 puff into the lungs 2 (two) times daily.  f/u pending xray

## 2020-11-25 NOTE — Patient Instructions (Addendum)
Please go to [facility] for your chest xray.   Their hours are 8am - 4:30 pm Monday - Friday.  Take your insurance card with you.  [facility] [phone]  [address] [place], [place] [address]  [address] [place], [place] [address]     Or you can go to [facility] main entrance and ask for Radiology Department to have a chest xray.  [facility] [address] [place], [place] [address] [phone]

## 2020-11-26 ENCOUNTER — Other Ambulatory Visit: Payer: Self-pay | Admitting: Medical

## 2021-01-25 ENCOUNTER — Encounter: Payer: Self-pay | Admitting: Medical

## 2021-01-25 ENCOUNTER — Other Ambulatory Visit: Payer: Self-pay

## 2021-01-25 ENCOUNTER — Ambulatory Visit: Payer: Managed Care, Other (non HMO) | Admitting: Medical

## 2021-01-25 VITALS — BP 116/70 | HR 79 | Ht 71.0 in | Wt 161.2 lb

## 2021-01-25 DIAGNOSIS — R2233 Localized swelling, mass and lump, upper limb, bilateral: Secondary | ICD-10-CM | POA: Diagnosis not present

## 2021-01-25 DIAGNOSIS — L988 Other specified disorders of the skin and subcutaneous tissue: Secondary | ICD-10-CM

## 2021-01-25 DIAGNOSIS — R21 Rash and other nonspecific skin eruption: Secondary | ICD-10-CM

## 2021-01-25 DIAGNOSIS — M2550 Pain in unspecified joint: Secondary | ICD-10-CM | POA: Diagnosis not present

## 2021-01-25 DIAGNOSIS — M62838 Other muscle spasm: Secondary | ICD-10-CM

## 2021-01-25 DIAGNOSIS — M546 Pain in thoracic spine: Secondary | ICD-10-CM

## 2021-01-25 NOTE — Progress Notes (Signed)
Subjective:  Curtis Walsh is a 55 y.o. male who presents for Chief Complaint  Patient presents with  . Shoulder Pain    Pt present for right shoulder pain which started 01/14/21  . Rash    Undiagnosed rash back of head, ear, eyes       Here for back pain.  Started last week, mid to upper back, spasm, hurts in ribs and scapula.  Pain up to 9/10 last week but improving.   A coworker touched the back and it was quite tender.  improving though.  Had some leftover flexeril and has been using this . Muscle relaxer has helped.    Works as a Psychologist, educational at gym, but denies any specific injury, trauma or heavy lifting.   Gets pains in hands, but no swelling.  Has some nodules of fingers, left small finger and right index, worried about psoriatic arthritis.  No rheumatoid issues in family.     Has rash of posterior scalp within hairline and inside each ear that can be scaly , itching, thinks its psoriasis.   No other aggravating or relieving factors.    No other c/o.  Past Medical History:  Diagnosis Date  . Cataract   . Migraines   . PVC (premature ventricular contraction)   . Warts    Current Outpatient Medications on File Prior to Visit  Medication Sig Dispense Refill  . albuterol (PROVENTIL HFA;VENTOLIN HFA) 108 (90 Base) MCG/ACT inhaler Inhale 2 puffs into the lungs every 6 (six) hours as needed for wheezing or shortness of breath. (Patient not taking: No sig reported) 1 Inhaler 0  . Fluticasone-Salmeterol (ADVAIR) 100-50 MCG/DOSE AEPB Inhale 1 puff into the lungs 2 (two) times daily. (Patient not taking: Reported on 01/25/2021) 1 each 0  . Multiple Vitamins-Minerals (EMERGEN-C IMMUNE PLUS) PACK Take 1 tablet by mouth 2 (two) times daily. (Patient not taking: Reported on 01/25/2021) 10 each 0   No current facility-administered medications on file prior to visit.     The following portions of the patient's history were reviewed and updated as appropriate: allergies, current medications,  past family history, past medical history, past social history, past surgical history and problem list.  ROS Otherwise as in subjective above  Objective: BP 116/70   Pulse 79   Ht 5\' 11"  (1.803 m)   Wt 161 lb 3.2 oz (73.1 kg)   SpO2 96%   BMI 22.48 kg/m   General appearance: alert, no distress, well developed, well nourished, white male Reddish and scaly patch in posterior scalp within hair line, similar pink/red scaly rash of bilat ear concha Neck: supple, no lymphadenopathy, no thyromegaly, no masses, nontender, normal range of motion Back with mild tenderness in the right rhomboid area but otherwise back nontender no deformity Tender over right latissimus inferiorly/posteriorly Shoulder and arms nontender normal range of motion no deformity or swelling, there is a soft tissue nodule of the right middle finger PIP posterior surface and similar nodules of left small finger posterior surface of the PIP suggestive of tophi Heart: RRR, normal S1, S2, no murmurs Lungs: CTA bilaterally, no wheezes, rhonchi, or rales Pulses: 2+ radial pulses, 2+ pedal pulses, normal cap refill Ext: no edema Arms neurovascularly intact    Assessment: Encounter Diagnoses  Name Primary?  Arthralgia, unspecified joint Yes  . Rash   . Nodule of finger of both hands   . Acute right-sided thoracic back pain   . Skin plaque   . Muscle spasm  Plan: Rash on scalp and ear concha suggestive of psoriasis.   Consider daily moisturizing lotion, OTC Cortaid for flare ups  Nodules of finger, arthralgias - he has concern for psoriasis although finger nodules suggest tophi /gout.  screening labs today.  No obvious psoriatic arthritis changes, no specific arthritis nodules of fingers and hands  Right back pain and spasm of rhomboids and latissimi dorsi - advised heat, massage, stretching, OTC Ibuprofen 200mg , 3 tablets BID x 5 days and use the muscle relaxer he has at home.  Use muscle relaxer QHS the next  few days    [name] was seen today for shoulder pain and rash.  Diagnoses and all orders for this visit:  Arthralgia, unspecified joint -     ANA -     Sedimentation rate -     Uric acid -     Rheumatoid factor  Rash -     ANA -     Sedimentation rate -     Uric acid -     Rheumatoid factor  Nodule of finger of both hands -     ANA -     Sedimentation rate -     Uric acid -     Rheumatoid factor  Acute right-sided thoracic back pain -     ANA -     Sedimentation rate -     Uric acid -     Rheumatoid factor  Skin plaque -     ANA -     Sedimentation rate -     Uric acid -     Rheumatoid factor  Muscle spasm    Follow up: pending labs

## 2021-01-26 LAB — RHEUMATOID FACTOR: Rhuematoid fact SerPl-aCnc: <10 IU/mL (ref <14.0)

## 2021-01-26 LAB — SEDIMENTATION RATE: Sed Rate: 3 mm/hr (ref 0–30)

## 2021-01-26 LAB — URIC ACID: Uric Acid: 4.3 mg/dL (ref 3.8–8.4)

## 2021-01-26 LAB — ANA: Anti Nuclear Antibody (ANA): NEGATIVE

## 2021-01-27 ENCOUNTER — Other Ambulatory Visit: Payer: Self-pay

## 2021-01-27 DIAGNOSIS — R2233 Localized swelling, mass and lump, upper limb, bilateral: Secondary | ICD-10-CM

## 2021-01-27 DIAGNOSIS — L988 Other specified disorders of the skin and subcutaneous tissue: Secondary | ICD-10-CM

## 2021-01-27 DIAGNOSIS — R21 Rash and other nonspecific skin eruption: Secondary | ICD-10-CM

## 2021-08-03 ENCOUNTER — Telehealth: Payer: Self-pay | Admitting: Internal Medicine

## 2021-08-03 NOTE — Telephone Encounter (Signed)
Left message for pt to call me back to see if hes had a colonoscopy

## 2021-10-12 ENCOUNTER — Encounter: Payer: Self-pay | Admitting: Internal Medicine

## 2021-11-22 ENCOUNTER — Encounter: Payer: Self-pay | Admitting: Internal Medicine

## 2021-11-29 NOTE — Telephone Encounter (Signed)
Left message for pt to call back about status of flu shot

## 2022-06-01 IMAGING — CR DG CHEST 2V
2 series · 2 of 2 positions shown · non-contrast
Comparison: None.

CLINICAL DATA: Cough short of breath COVID

EXAM:
CHEST - 2 VIEW

[w chest pa]
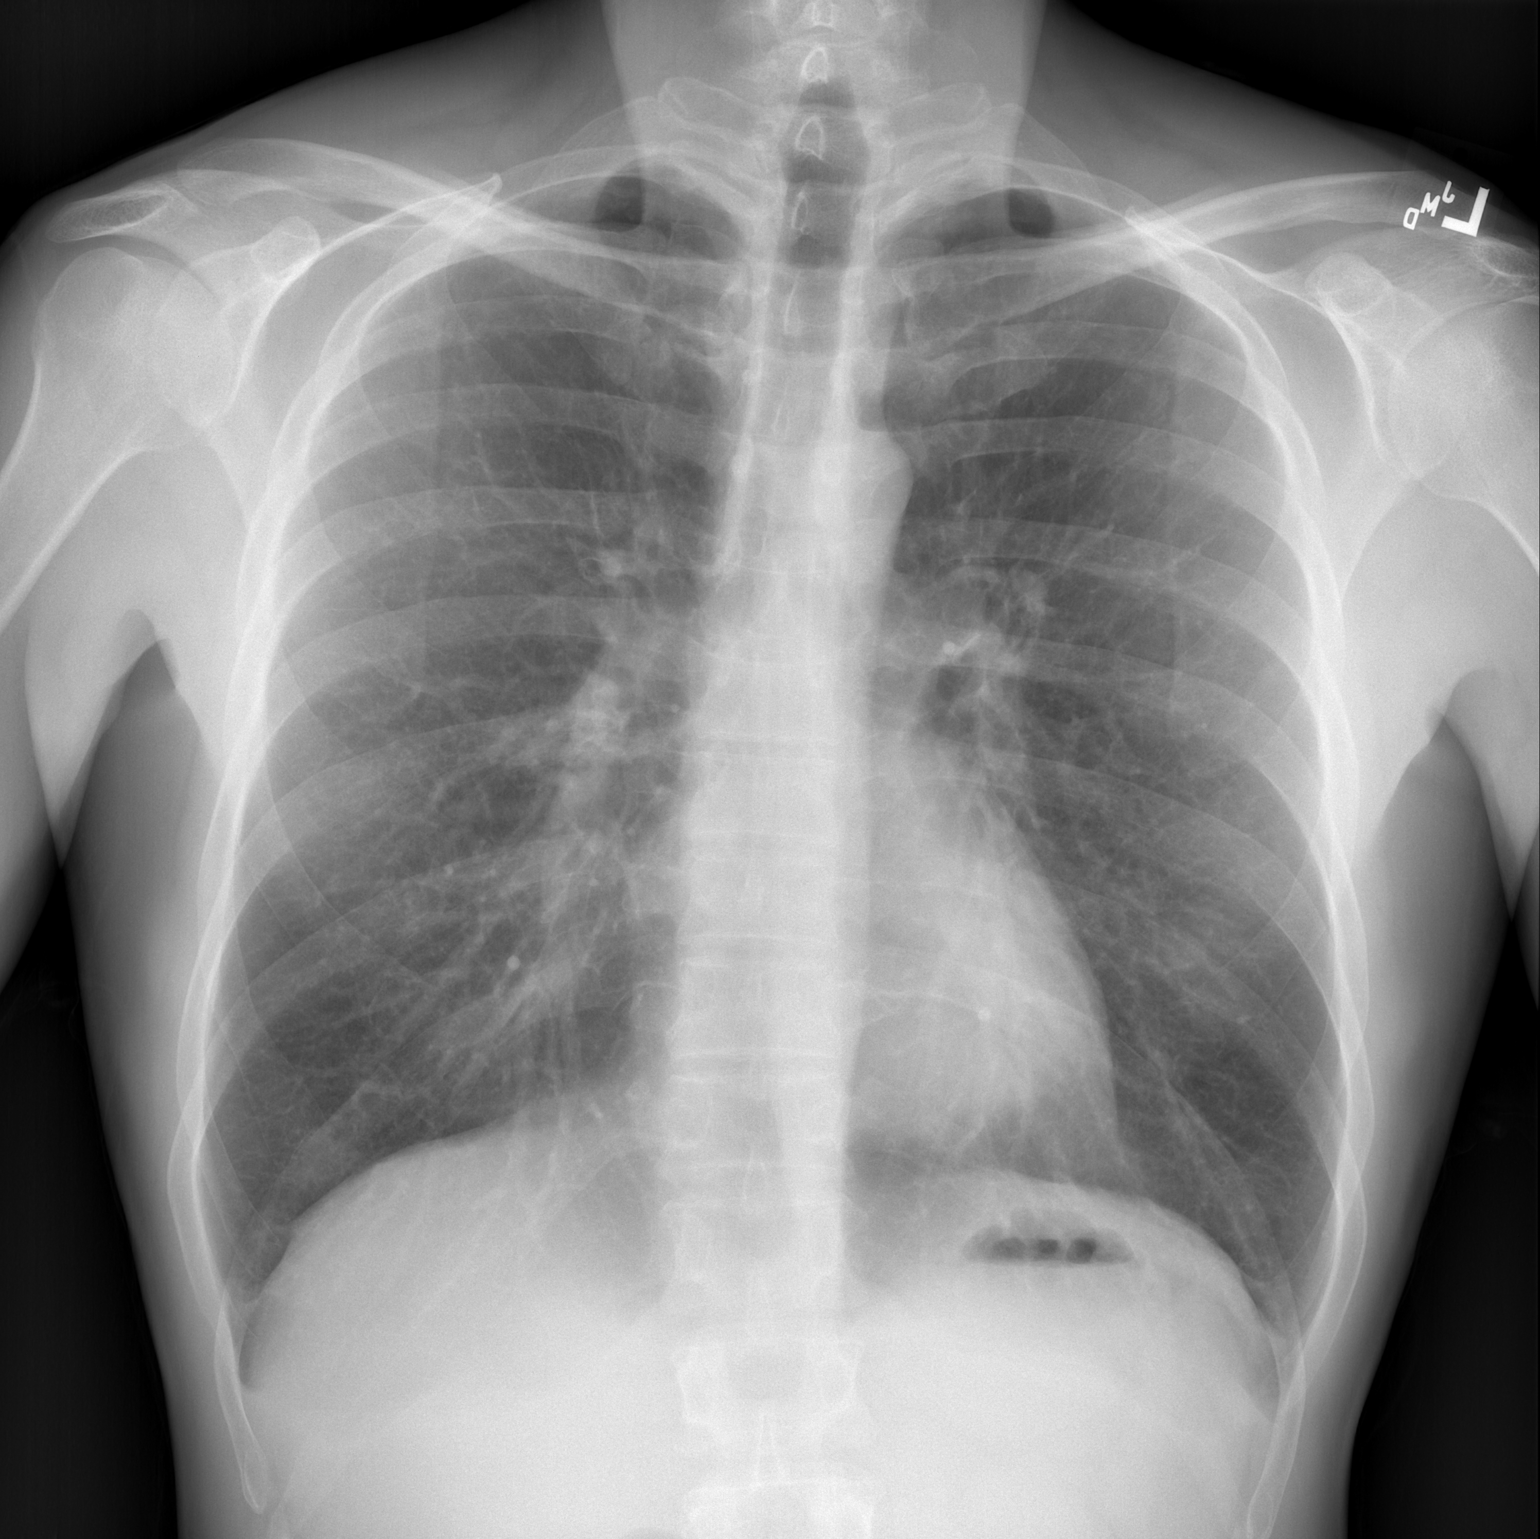

[w chest lat]
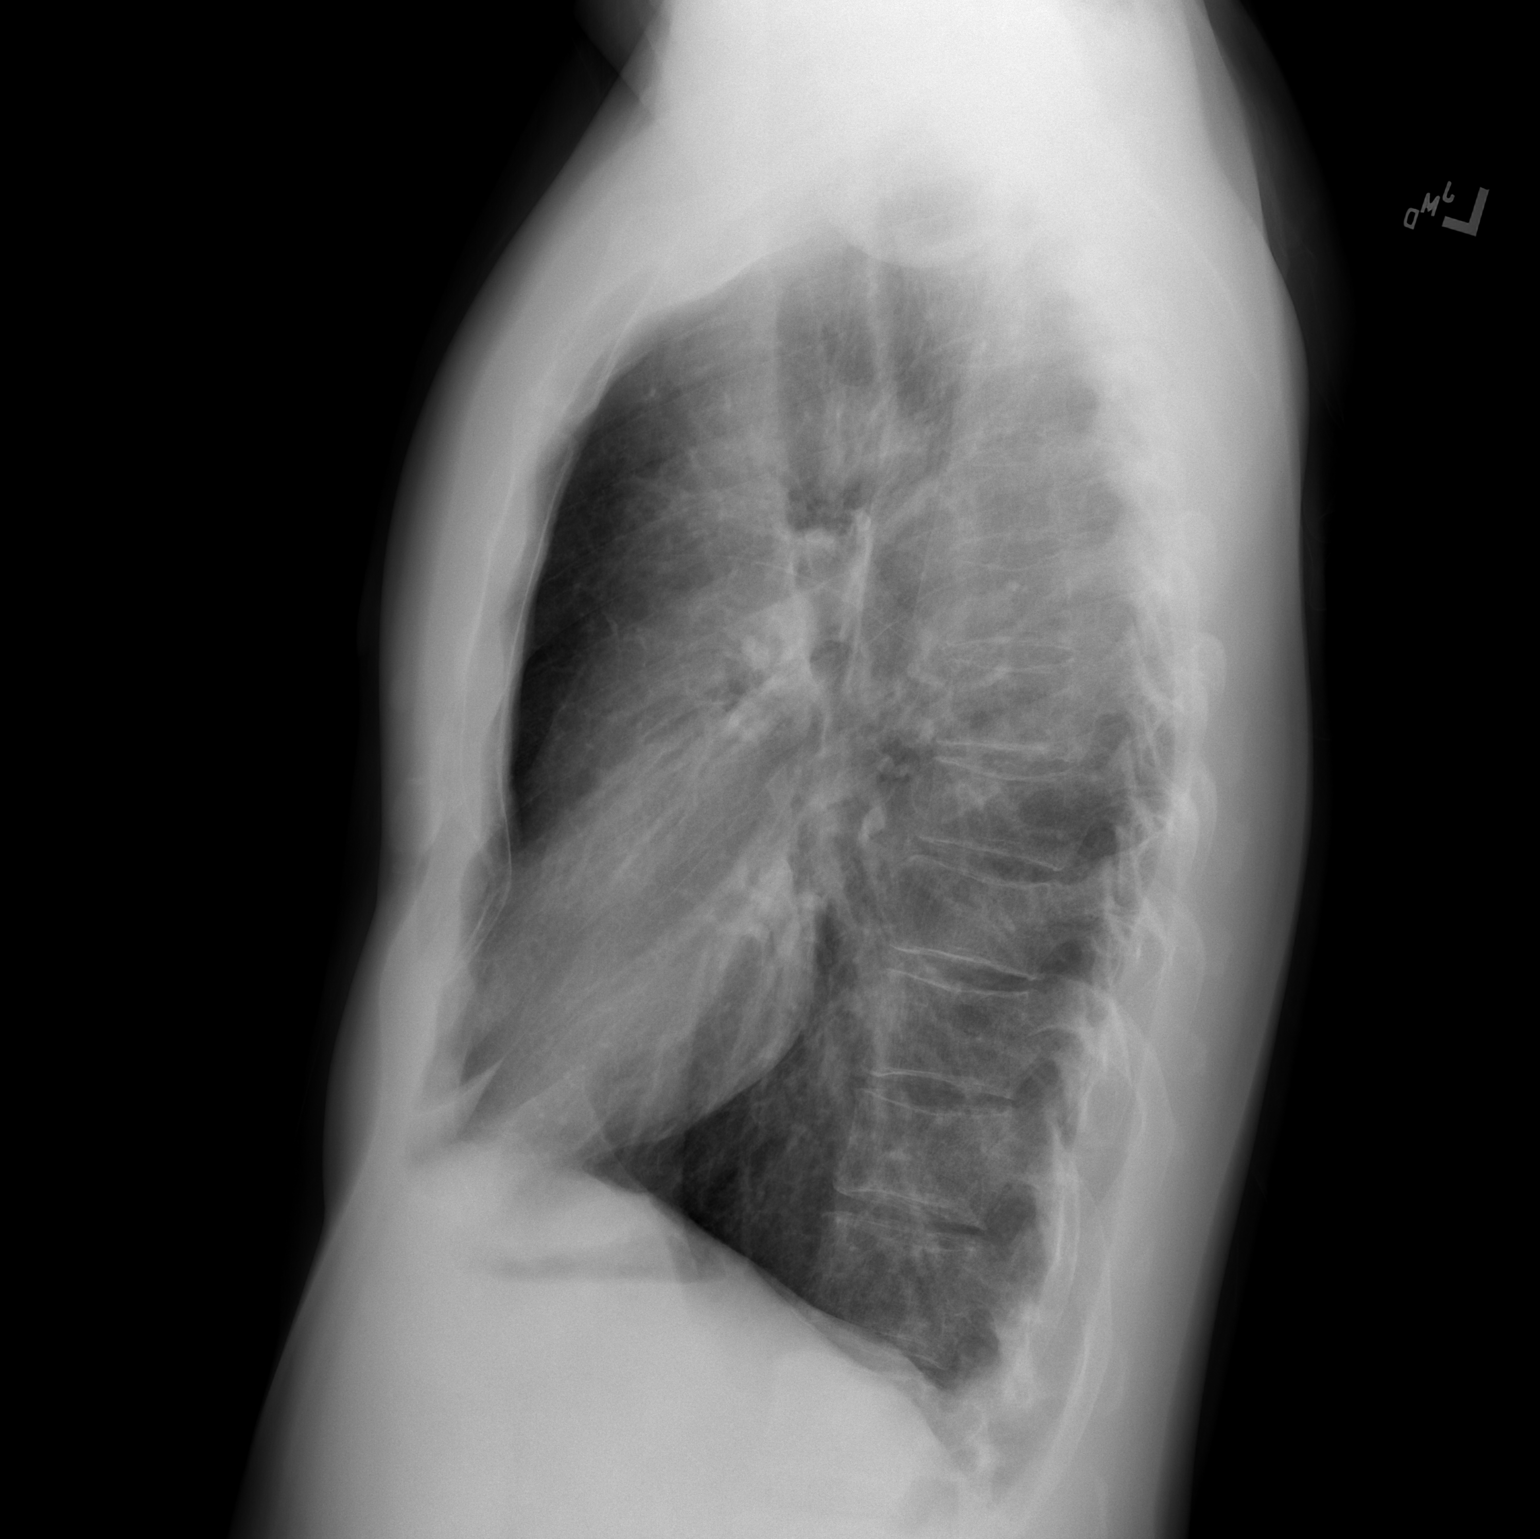

[2 of 2 positions shown; findings below may reference images not displayed]

FINDINGS: The heart size and mediastinal contours are within normal limits.
Both lungs are clear. The visualized skeletal structures are
unremarkable.
IMPRESSION: No active cardiopulmonary disease.

## 2022-07-12 ENCOUNTER — Encounter: Payer: Self-pay | Admitting: Internal Medicine

## 2022-08-15 ENCOUNTER — Encounter: Payer: Self-pay | Admitting: Internal Medicine

## 2024-06-06 ENCOUNTER — Ambulatory Visit: Admitting: Family Medicine

## 2024-06-06 ENCOUNTER — Encounter: Payer: Self-pay | Admitting: Family Medicine

## 2024-06-06 VITALS — BP 110/68 | HR 54 | Ht 71.0 in | Wt 159.2 lb

## 2024-06-06 DIAGNOSIS — H259 Unspecified age-related cataract: Secondary | ICD-10-CM

## 2024-06-06 DIAGNOSIS — R5383 Other fatigue: Secondary | ICD-10-CM

## 2024-06-06 DIAGNOSIS — F5101 Primary insomnia: Secondary | ICD-10-CM | POA: Diagnosis not present

## 2024-06-06 DIAGNOSIS — M7501 Adhesive capsulitis of right shoulder: Secondary | ICD-10-CM

## 2024-06-06 DIAGNOSIS — M542 Cervicalgia: Secondary | ICD-10-CM | POA: Diagnosis not present

## 2024-06-06 NOTE — Progress Notes (Signed)
 Name: Curtis Walsh   Date of Visit: 06/06/24   Date of last visit with me: Visit date not found   CHIEF COMPLAINT:  Chief Complaint  Patient presents with   New Patient (Initial Visit)    Possible psoriasis head, scalp and ears-hip and knee sharp pain,   shoulders frozen that keeps him from working out for 2 years-sleep disrupted-blind rt eye w/ cataract-has a hard time keeping weight up-       HPI:  Discussed the use of AI scribe software for clinical note transcription with the patient, who gave verbal consent to proceed.  History of Present Illness   Curtis Walsh is a 58 year old male who presents with ongoing shoulder pain and sleep disturbances.  He has a history of frozen shoulders affecting both arms over the past two years. The first episode occurred suddenly overnight, while the second developed gradually over six months. He received a cortisone injection for the second shoulder and underwent physical therapy for the first. Currently, he is managing the condition on his own as he was unable to see the same physical therapist for the second shoulder. He describes significant pain and a loss of flexibility, noting that he can no longer flatten his arms against the wall or perform movements he used to, such as grabbing his arms behind his back. The right shoulder is currently the worst affected. He has a family history of frozen shoulders, with his mother, grandmother, and aunt experiencing similar issues.  He experiences sleep disturbances, waking up after four to six hours and having difficulty falling back asleep. He attributes some of this to being startled awake or being disturbed by pets. He has tried maintaining a consistent sleep schedule without success. He also reports occasional restless leg sensations when sitting.  He has a history of psoriasis, which improved significantly after receiving cortisone injections in October of the previous year. However, he continues  to experience psoriasis around his eyelids and ears, which he manages with triamcinolone and Nizoral shampoo.  He reports a history of PVCs, which became more frequent following a traumatic event in February, leading to an ER visit in April. He has experienced PVCs throughout his life but noted a unique increase in frequency during this period.  He is concerned about his muscle mass loss and has been unable to maintain his previous weight of 177 pounds, currently weighing around 157 pounds. He has a history of difficulty gaining weight and has previously tracked his protein intake, aiming for 180 pounds with a caloric intake of 2300-2600 calories per day.  He mentions a cataract in his left eye, which he believes worsened after a suspected COVID-19 infection in March 2020. He also reports tinnitus in his right ear that began during the same illness and has persisted since.  He has been experiencing mood changes, which his partner has noticed, and attributes some of this to a traumatic event earlier in the year. He is not currently taking a multivitamin but uses fish oil and magnesium citrate, the latter for sleep.         OBJECTIVE:       01/25/2021    1:33 PM  Depression screen PHQ 2/9  Decreased Interest 0  Down, Depressed, Hopeless 0  PHQ - 2 Score 0     BP Readings from Last 3 Encounters:  06/06/24 110/68  01/25/21 116/70  03/28/17 114/70    BP 110/68   Pulse (!) 54   Ht 5' 11 (  1.803 m)   Wt 159 lb 3.2 oz (72.2 kg)   SpO2 97%   BMI 22.20 kg/m    Physical Exam   MEASUREMENTS: Height- 5'11, Weight- 157. MUSCULOSKELETAL: Bone cyst and bone spurs on hand.      Physical Exam Constitutional:      Appearance: Normal appearance.  Neurological:     Mental Status: He is alert.    Inspection of Right shoulder reveals no gross abnormalities, there is decrease external rotation by about 10 degrees on right compared to left with notable pain. Internal rotation and abduction  appear appropriate.  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 Possible underlying causes include autoimmune conditions, thyroid dysfunction, or cervical spine issues. Family history noted. Corticosteroid injection under ultrasound guidance offered. - Order cervical spine x-ray to assess for degenerative disc disease and C5 vertebra involvement. - Order TSH to evaluate thyroid function. - Order HbA1c to rule out diabetes. - Encourage continuation of range of motion exercises and strength training. - Discuss option of corticosteroid injection under ultrasound guidance if conservative measures fail.  Cervical degenerative disc disease (suspected) Suspected cervical degenerative disc disease potentially contributing to frozen shoulder symptoms. Possible connection between C5 vertebra and frozen shoulder. - Order cervical spine x-ray to assess for degenerative disc disease.  Psoriasis with scalp, eyelid, and ear involvement and seborrheic dermatitis Chronic psoriasis with scalp, eyelid, and ear involvement. Seborrheic dermatitis present. - Continue current topical treatments with triamcinolone and Nizoral shampoo.  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 No current use of multivitamins or appropriate magnesium supplementation. - Order iron and ferritin levels to evaluate for iron deficiency. - Recommend switching to magnesium glycinate for better absorption and potential sleep benefits. - Consider starting a multivitamin.  Possible iron deficiency (pending evaluation) Possible iron  deficiency considered due to sleep disturbances and restless leg sensations. Previous CBC normal, iron levels not checked. - Order iron and ferritin levels to evaluate for iron deficiency.  Possible low testosterone  (pending evaluation) Concerns about possible low testosterone  due to sleep issues and muscle mass loss. Testing agreed upon due to symptoms. - Order morning testosterone  levels to assess for low testosterone . - Advise on increasing caloric intake to 2900 calories per day with 150 grams of protein if actively working out.  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 Consideration for surgical intervention if symptoms worsen. - Consider referral to hand surgeon for evaluation and potential aspiration or surgical intervention if symptoms worsen.  Right eye cataract Right eye cataract with previous cataract surgery in the left eye.  - Refer to ophthalmology for evaluation and management options.         Marlette Curvin A. Vita MD [facility]

## 2024-06-10 ENCOUNTER — Other Ambulatory Visit

## 2024-06-12 ENCOUNTER — Other Ambulatory Visit

## 2024-06-12 LAB — HEMOGLOBIN A1C
Est. average glucose Bld gHb Est-mCnc: 111 mg/dL
Hgb A1c MFr Bld: 5.5 % (ref 4.8–5.6)

## 2024-06-13 LAB — IRON,TIBC AND FERRITIN PANEL
Ferritin: 119 ng/mL (ref 30–400)
Iron Saturation: 17 % (ref 15–55)
Iron: 61 ug/dL (ref 38–169)
Total Iron Binding Capacity: 353 ug/dL (ref 250–450)
UIBC: 292 ug/dL (ref 111–343)

## 2024-06-16 ENCOUNTER — Ambulatory Visit: Payer: Self-pay | Admitting: Family Medicine

## 2024-06-17 LAB — TESTOSTERONE,FREE AND TOTAL
Testosterone, Free: 4.4 pg/mL — ABNORMAL LOW (ref 7.2–24.0)
Testosterone: 692 ng/dL (ref 264–916)

## 2024-06-17 LAB — TSH: TSH: 3.35 u[IU]/mL (ref 0.450–4.500)

## 2024-06-24 ENCOUNTER — Ambulatory Visit
Admission: RE | Admit: 2024-06-24 | Discharge: 2024-06-24 | Disposition: A | Discharge status: Home / Self Care | Source: Ambulatory Visit | Attending: Family Medicine

## 2024-06-24 DIAGNOSIS — M542 Cervicalgia: Secondary | ICD-10-CM

## 2024-06-25 ENCOUNTER — Ambulatory Visit: Admitting: Family Medicine

## 2024-06-25 VITALS — BP 120/70 | HR 65 | Wt 158.2 lb

## 2024-06-25 DIAGNOSIS — E291 Testicular hypofunction: Secondary | ICD-10-CM

## 2024-06-25 DIAGNOSIS — R5383 Other fatigue: Secondary | ICD-10-CM

## 2024-06-25 NOTE — Progress Notes (Signed)
   Name: Curtis Walsh   Date of Visit: 06/25/24   Date of last visit with me: 06/06/2024   CHIEF COMPLAINT:  Chief Complaint  Patient presents with   Follow-up    Follow up 06/06/2024. Wants to talk about testosterone  being low.        HPI:  Discussed the use of AI scribe software for clinical note transcription with the patient, who gave verbal consent to proceed.  History of Present Illness   He is a 58 year old male who presents for follow-up of his testosterone  labs.  Recent lab results showed normal total testosterone  values, but his free testosterone  was slightly low.  His shoulder feels better at this time and he is doing well with it. He has no other concerns at this time.         OBJECTIVE:       01/25/2021    1:33 PM  Depression screen PHQ 2/9  Decreased Interest 0  Down, Depressed, Hopeless 0  PHQ - 2 Score 0     BP Readings from Last 3 Encounters:  06/25/24 120/70  06/06/24 110/68  01/25/21 116/70    BP 120/70   Pulse 65   Wt 158 lb 3.2 oz (71.8 kg)   SpO2 99%   BMI 22.06 kg/m    Physical Exam          Physical Exam Neurological:     General: No focal deficit present.     Mental Status: He is alert and oriented to person, place, and time. Mental status is at baseline.     ASSESSMENT/PLAN:   Assessment & Plan Other fatigue  Testosterone  deficiency in male    Assessment and Plan    Low free testosterone  Free testosterone  slightly low, likely due to sex hormone binding globulin. Other labs normal, excluding hypothyroidism or liver disease. - Order morning testosterone  level test.  Including free testosterone  and sex hormone binding globulin. - Advise on natural methods to raise testosterone  if levels remain low.         Addysyn Fern A. Vita MD [facility]

## 2024-06-26 ENCOUNTER — Other Ambulatory Visit

## 2024-06-26 DIAGNOSIS — E291 Testicular hypofunction: Secondary | ICD-10-CM

## 2024-06-26 DIAGNOSIS — R5383 Other fatigue: Secondary | ICD-10-CM

## 2024-06-30 LAB — TESTOSTERONE, FREE, TOTAL, SHBG
Sex Hormone Binding: 76 nmol/L (ref 19.3–76.4)
Testosterone, Free: 4 pg/mL — ABNORMAL LOW (ref 7.2–24.0)
Testosterone: 767 ng/dL (ref 264–916)

## 2024-07-01 ENCOUNTER — Ambulatory Visit: Payer: Self-pay | Admitting: Family Medicine

## 2024-07-08 ENCOUNTER — Ambulatory Visit: Admitting: Family Medicine

## 2024-07-08 ENCOUNTER — Encounter: Payer: Self-pay | Admitting: Family Medicine

## 2024-07-08 VITALS — BP 122/74 | HR 64 | Wt 159.2 lb

## 2024-07-08 DIAGNOSIS — E291 Testicular hypofunction: Secondary | ICD-10-CM

## 2024-07-08 DIAGNOSIS — M489 Spondylopathy, unspecified: Secondary | ICD-10-CM | POA: Diagnosis not present

## 2024-07-08 LAB — CBC WITH DIFFERENTIAL/PLATELET
Basophils Absolute: 0 x10E3/uL (ref 0.0–0.2)
Basos: 1 %
EOS (ABSOLUTE): 0.1 x10E3/uL (ref 0.0–0.4)
Eos: 1 %
Hematocrit: 46.4 % (ref 37.5–51.0)
Hemoglobin: 15.4 g/dL (ref 13.0–17.7)
Immature Grans (Abs): 0 x10E3/uL (ref 0.0–0.1)
Immature Granulocytes: 0 %
Lymphocytes Absolute: 2.4 x10E3/uL (ref 0.7–3.1)
Lymphs: 33 %
MCH: 31.4 pg (ref 26.6–33.0)
MCHC: 33.2 g/dL (ref 31.5–35.7)
MCV: 95 fL (ref 79–97)
Monocytes Absolute: 0.5 x10E3/uL (ref 0.1–0.9)
Monocytes: 6 %
Neutrophils Absolute: 4.3 x10E3/uL (ref 1.4–7.0)
Neutrophils: 59 %
Platelets: 264 x10E3/uL (ref 150–450)
RBC: 4.9 x10E6/uL (ref 4.14–5.80)
RDW: 13.5 % (ref 11.6–15.4)
WBC: 7.3 x10E3/uL (ref 3.4–10.8)

## 2024-07-08 MED ORDER — TESTOSTERONE CYPIONATE 100 MG/ML IM SOLN: 100.0000 mg | INTRAMUSCULAR | 0 refills | Status: DC
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
## 2024-07-11 ENCOUNTER — Telehealth: Payer: Self-pay

## 2024-07-11 DIAGNOSIS — E291 Testicular hypofunction: Secondary | ICD-10-CM

## 2024-07-11 NOTE — Telephone Encounter (Signed)
 Copied from CRM [id number]. Topic: Clinical - Medication Question >> Jul 11, 2024 10:20 AM Marissa P wrote: Reason for CRM: Husband called and said that the testosterone  cypionate (DEPOTESTOTERONE CYPIONATE) 100 MG/ML injection does not have the 100 mg dosage and only has the 200mg  so wondering if it can be changed to that.  Please call [phone] if anything

## 2024-07-11 NOTE — Telephone Encounter (Signed)
 Left voicemail for patient

## 2024-07-11 NOTE — Telephone Encounter (Signed)
 Copied from CRM [id number]. Topic: Clinical - Medication Question >> Jul 11, 2024 10:23 AM Marissa P wrote: Reason for CRM: Husband called and said that the testosterone  cypionate (DEPOTESTOTERONE CYPIONATE) 100 MG/ML injection does not have the 100 mg dosage and only has the 200mg  so wondering if it can be changed to that.  Please call [phone] if anything. [facility]  [phone] Main# [phone] Phamacy# [phone] [address] [place] [address]

## 2024-07-14 MED ORDER — TESTOSTERONE 20.25 MG/ACT (1.62%) TD GEL: 2.0000 | Freq: Every day | TRANSDERMAL | 1 refills | Status: DC

## 2024-07-14 NOTE — Telephone Encounter (Signed)
 Patient is informed of new rx and the directions

## 2024-07-14 NOTE — Addendum Note (Signed)
 Addended by: Shem Plemmons on: 07/14/2024 08:36 AM   Modules accepted: Orders

## 2024-07-24 ENCOUNTER — Ambulatory Visit (HOSPITAL_COMMUNITY)
Admission: RE | Admit: 2024-07-24 | Discharge: 2024-07-24 | Disposition: A | Discharge status: Home / Self Care | Source: Ambulatory Visit | Attending: Family Medicine | Admitting: Family Medicine

## 2024-07-24 DIAGNOSIS — E291 Testicular hypofunction: Secondary | ICD-10-CM | POA: Diagnosis not present

## 2024-07-24 DIAGNOSIS — Z79890 Hormone replacement therapy (postmenopausal): Secondary | ICD-10-CM | POA: Diagnosis not present

## 2024-07-24 LAB — ECHOCARDIOGRAM COMPLETE
AR max vel: 2.54 cm2
AV Area VTI: 2.15 cm2
AV Area mean vel: 2.29 cm2
AV Mean grad: 3 mmHg
AV Peak grad: 6.6 mmHg
Ao pk vel: 1.28 m/s
Area-P 1/2: 3.27 cm2
Calc EF: 67.6 %
MV VTI: 2.36 cm2
S' Lateral: 3.1 cm
Single Plane A2C EF: 65.9 %
Single Plane A4C EF: 67.5 %

## 2024-08-08 ENCOUNTER — Ambulatory Visit (INDEPENDENT_AMBULATORY_CARE_PROVIDER_SITE_OTHER): Admitting: Family Medicine

## 2024-08-08 ENCOUNTER — Encounter: Payer: Self-pay | Admitting: Family Medicine

## 2024-08-08 VITALS — BP 110/70 | HR 63 | Wt 156.6 lb

## 2024-08-08 DIAGNOSIS — Z79890 Hormone replacement therapy (postmenopausal): Secondary | ICD-10-CM | POA: Diagnosis not present

## 2024-08-08 NOTE — Progress Notes (Signed)
   Name: Curtis Walsh   Date of Visit: 08/08/24   Date of last visit with me: 07/08/2024   CHIEF COMPLAINT:  Chief Complaint  Patient presents with   Follow-up    4 week follow up. Doing well, forgot to use gel a few times, when he does use it he sleeps great.        HPI:  Discussed the use of AI scribe software for clinical note transcription with the patient, who gave verbal consent to proceed.  History of Present Illness   Curtis Walsh is a 58 year old male who presents for follow-up regarding testosterone  therapy.  He has been inconsistently using his daily testosterone  gel, often forgetting to apply it. He is unsure if he should apply it later in the day if missed in the morning. On days he forgets to use the gel, his sleep quality is poor.  He reports a decrease in knee joint popping but describes a persistent 'pinchy' and sharp sensation in the shoulder area, particularly in the C5 zone, which occurs when lying on one side. He manages this by adjusting his arm position. He describes his pain tolerance as high and finds the shoulder discomfort manageable.  He had a colonoscopy on June 23, 2024, at [place], where multiple polyps were removed. He is scheduled for a repeat colonoscopy in three years due to the number of polyps found.         OBJECTIVE:       01/25/2021    1:33 PM  Depression screen PHQ 2/9  Decreased Interest 0  Down, Depressed, Hopeless 0  PHQ - 2 Score 0     BP Readings from Last 3 Encounters:  08/08/24 110/70  07/08/24 122/74  06/25/24 120/70    BP 110/70   Pulse 63   Wt 156 lb 9.6 oz (71 kg)   SpO2 98%   BMI 21.84 kg/m    Physical Exam          Physical Exam Constitutional:      Appearance: Normal appearance.  Neurological:     General: No focal deficit present.     Mental Status: He is alert and oriented to person, place, and time. Mental status is at baseline.     ASSESSMENT/PLAN:   Assessment & Plan Long-term current  use of testosterone  replacement therapy    Assessment and Plan    Testosterone  deficiency Managed with testosterone  gel. Inconsistent application noted. Improvement in sleep and joint symptoms. Shoulder discomfort likely positional. No contraindications to therapy. - Draw blood for testosterone  levels today. - Repeat testosterone  level assessment in two months. - Continue daily testosterone  gel, emphasize adherence. - Consider injectable testosterone  if gel insufficient after two to three months.  Colonic polyps Multiple polyps removed during recent colonoscopy. No concerning pathology. Repeat colonoscopy recommended due to number of polyps. - Schedule repeat colonoscopy in three years.         Kincaid Tiger A. Vita MD [facility]

## 2024-08-10 LAB — CBC WITH DIFFERENTIAL/PLATELET
Basophils Absolute: 0 x10E3/uL (ref 0.0–0.2)
Basos: 1 %
EOS (ABSOLUTE): 0.1 x10E3/uL (ref 0.0–0.4)
Eos: 2 %
Hematocrit: 45.9 % (ref 37.5–51.0)
Hemoglobin: 15.1 g/dL (ref 13.0–17.7)
Immature Grans (Abs): 0 x10E3/uL (ref 0.0–0.1)
Immature Granulocytes: 0 %
Lymphocytes Absolute: 2.4 x10E3/uL (ref 0.7–3.1)
Lymphs: 43 %
MCH: 31.8 pg (ref 26.6–33.0)
MCHC: 32.9 g/dL (ref 31.5–35.7)
MCV: 97 fL (ref 79–97)
Monocytes Absolute: 0.4 x10E3/uL (ref 0.1–0.9)
Monocytes: 8 %
Neutrophils Absolute: 2.6 x10E3/uL (ref 1.4–7.0)
Neutrophils: 46 %
Platelets: 226 x10E3/uL (ref 150–450)
RBC: 4.75 x10E6/uL (ref 4.14–5.80)
RDW: 13.1 % (ref 11.6–15.4)
WBC: 5.6 x10E3/uL (ref 3.4–10.8)

## 2024-08-10 LAB — TESTOSTERONE,FREE AND TOTAL
Testosterone, Free: 8.7 pg/mL (ref 7.2–24.0)
Testosterone: 702 ng/dL (ref 264–916)

## 2024-08-11 ENCOUNTER — Ambulatory Visit: Payer: Self-pay | Admitting: Family Medicine

## 2024-10-14 ENCOUNTER — Ambulatory Visit: Payer: Self-pay | Admitting: Family Medicine

## 2024-10-14 ENCOUNTER — Encounter: Payer: Self-pay | Admitting: Family Medicine

## 2024-10-14 VITALS — BP 124/72 | HR 58 | Ht 71.0 in | Wt 154.2 lb

## 2024-10-14 DIAGNOSIS — Z125 Encounter for screening for malignant neoplasm of prostate: Secondary | ICD-10-CM

## 2024-10-14 DIAGNOSIS — Z0000 Encounter for general adult medical examination without abnormal findings: Secondary | ICD-10-CM | POA: Diagnosis not present

## 2024-10-14 DIAGNOSIS — H259 Unspecified age-related cataract: Secondary | ICD-10-CM | POA: Diagnosis not present

## 2024-10-14 DIAGNOSIS — Z87891 Personal history of nicotine dependence: Secondary | ICD-10-CM | POA: Diagnosis not present

## 2024-10-14 DIAGNOSIS — Z23 Encounter for immunization: Secondary | ICD-10-CM

## 2024-10-14 DIAGNOSIS — E291 Testicular hypofunction: Secondary | ICD-10-CM

## 2024-10-14 DIAGNOSIS — Z79890 Hormone replacement therapy (postmenopausal): Secondary | ICD-10-CM

## 2024-10-14 LAB — LIPID PANEL

## 2024-10-14 NOTE — Progress Notes (Signed)
 Name: Curtis Walsh   Date of Visit: 10/14/24   Date of last visit with me: 08/08/2024   CHIEF COMPLAINT:  Chief Complaint  Patient presents with   Annual Exam    Cpe. Fasting.        HPI:  Discussed the use of AI scribe software for clinical note transcription with the patient, who gave verbal consent to proceed.  History of Present Illness   Curtis Walsh is a 58 year old male who presents for follow-up on testosterone  therapy and smoking cessation.  Since initiating testosterone  therapy, he has experienced improved sleep and reduced joint discomfort, particularly in the shoulder joints. However, he continues to experience popping in his knees and ankles, especially in cold weather. He has not resumed gym workouts due to his husband's recent health issues.  He has a history of smoking, having quit in 2017, but recently relapsed, smoking up to five cigarettes a day over the past two months. He attributes this to anxiety and the influence of his mother-in-law, who smokes and lives nearby. He occasionally uses nicotine patches to manage his smoking.  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 The cataracts are genetic, as his maternal grandmother also had them removed.         OBJECTIVE:       10/14/2024   10:40 AM  Depression screen PHQ 2/9  Decreased Interest 0  Down, Depressed, Hopeless 1  PHQ - 2 Score 1  Altered sleeping 1  Tired, decreased energy 0  Change in appetite 0  Feeling bad or failure about yourself  2  Trouble concentrating 2  Moving slowly or fidgety/restless 0  Suicidal thoughts 0  PHQ-9 Score 6     BP Readings from Last 3 Encounters:  10/14/24 124/72  08/08/24 110/70  07/08/24 122/74    BP 124/72   Pulse (!) 58   Ht 5' 11 (1.803 m)   Wt 154 lb 3.2 oz (69.9 kg)   SpO2 98%   BMI 21.51 kg/m    Physical Exam          Physical Exam Constitutional:       Appearance: Normal appearance.  Cardiovascular:     Rate and Rhythm: Normal rate.     Pulses: Normal pulses.  Neurological:     General: No focal deficit present.     Mental Status: He is alert and oriented to person, place, and time. Mental status is at baseline.     ASSESSMENT/PLAN:   Assessment & Plan Long-term current use of testosterone  replacement therapy  Testosterone  deficiency in male  Annual physical exam  Need for COVID-19 vaccine  Former smoker  Encounter for screening prostate specific antigen (PSA) measurement  Age-related cataract of left eye, unspecified age-related cataract type    Assessment and Plan    Annual Physical -Comprehensive annual physical exam completed today. Reviewed interval history, current medical issues, medications, allergies, and preventive care needs. Addressed all patient questions and concerns. Discussed lifestyle factors including diet, exercise, sleep, and stress management. Reviewed recommended age-appropriate screenings, labs, and vaccinations. Counseling provided on healthy habits and routine health maintenance. Follow-up as indicated based on findings and results.   Testosterone  deficiency in male Testosterone  therapy improved sleep and reduced joint noise. Discussed long-term implications, including potential decrease in natural testosterone  production after 18 months, possibly requiring lifelong therapy. Emphasized symptom management and individual response variability. -  Ordered PSA test. - Ordered cholesterol test.  Nicotine dependence, cigarettes Resumed smoking five cigarettes daily due to anxiety and secondhand smoke exposure. Discussed nicotine patches and cessation aids. - Ordered chest CT scan for baseline evaluation due to smoking history. -Smoking/Tobacco Cessation Counseling Curtis Walsh is a current user of tobacco or nicotine products. He is ready to quit at this time. Counseling provided today addressed the  risks of continued use and the benefits of cessation. Discussed tobacco/nicotine use history, readiness to quit, and evidence-based treatment options including behavioral strategies, support resources, and pharmacologic therapies. Provided encouragement and educational materials on steps and resources to quit smoking. Patient questions were addressed, and follow-up recommended for continued support. Total time spent on counseling: 5 minutes.    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 Last ophthalmology visit in 2018. Family history of cataracts noted. Referral needed for further evaluation. - Referred to ophthalmology for evaluation of right eye cataract.         Curtis Walsh A. Vita MD [facility]

## 2024-10-14 NOTE — Patient Instructions (Addendum)
 Please call your optho office   [phone]

## 2024-10-15 LAB — CBC WITH DIFFERENTIAL/PLATELET
Basophils Absolute: 0 x10E3/uL (ref 0.0–0.2)
Basos: 1 %
EOS (ABSOLUTE): 0.1 x10E3/uL (ref 0.0–0.4)
Eos: 2 %
Hematocrit: 46.7 % (ref 37.5–51.0)
Hemoglobin: 15.6 g/dL (ref 13.0–17.7)
Immature Grans (Abs): 0 x10E3/uL (ref 0.0–0.1)
Immature Granulocytes: 0 %
Lymphocytes Absolute: 2.8 x10E3/uL (ref 0.7–3.1)
Lymphs: 37 %
MCH: 31.6 pg (ref 26.6–33.0)
MCHC: 33.4 g/dL (ref 31.5–35.7)
MCV: 95 fL (ref 79–97)
Monocytes Absolute: 0.5 x10E3/uL (ref 0.1–0.9)
Monocytes: 6 %
Neutrophils Absolute: 4.1 x10E3/uL (ref 1.4–7.0)
Neutrophils: 54 %
Platelets: 229 x10E3/uL (ref 150–450)
RBC: 4.93 x10E6/uL (ref 4.14–5.80)
RDW: 11.7 % (ref 11.6–15.4)
WBC: 7.6 x10E3/uL (ref 3.4–10.8)

## 2024-10-15 LAB — COMPREHENSIVE METABOLIC PANEL WITH GFR
ALT: 8 IU/L (ref 0–44)
AST: 13 IU/L (ref 0–40)
Albumin: 4.3 g/dL (ref 3.8–4.9)
Alkaline Phosphatase: 74 IU/L (ref 47–123)
BUN/Creatinine Ratio: 11 (ref 9–20)
BUN: 11 mg/dL (ref 6–24)
Bilirubin Total: 0.3 mg/dL (ref 0.0–1.2)
CO2: 25 mmol/L (ref 20–29)
Calcium: 9.3 mg/dL (ref 8.7–10.2)
Chloride: 102 mmol/L (ref 96–106)
Creatinine, Ser: 0.98 mg/dL (ref 0.76–1.27)
Globulin, Total: 2.5 g/dL (ref 1.5–4.5)
Glucose: 90 mg/dL (ref 70–99)
Potassium: 5.1 mmol/L (ref 3.5–5.2)
Sodium: 141 mmol/L (ref 134–144)
Total Protein: 6.8 g/dL (ref 6.0–8.5)
eGFR: 91 mL/min/1.73 (ref >59)

## 2024-10-15 LAB — LIPID PANEL
Cholesterol, Total: 170 mg/dL (ref 100–199)
HDL: 43 mg/dL (ref >39)
LDL CALC COMMENT:: 4 ratio (ref 0.0–5.0)
LDL Chol Calc (NIH): 115 mg/dL — AB (ref 0–99)
Triglycerides: 62 mg/dL (ref 0–149)
VLDL Cholesterol Cal: 12 mg/dL (ref 5–40)

## 2024-10-15 LAB — TESTOSTERONE,FREE AND TOTAL
Testosterone, Free: 6.1 pg/mL — AB (ref 7.2–24.0)
Testosterone: 654 ng/dL (ref 264–916)

## 2024-10-15 LAB — PSA, TOTAL AND FREE
PSA, Free Pct: 35 %
PSA, Free: 0.28 ng/mL
Prostate Specific Ag, Serum: 0.8 ng/mL (ref 0.0–4.0)

## 2024-10-17 ENCOUNTER — Ambulatory Visit: Payer: Self-pay | Admitting: Family Medicine

## 2024-10-27 ENCOUNTER — Inpatient Hospital Stay: Admission: RE | Admit: 2024-10-27 | Discharge: 2024-10-27 | Attending: Family Medicine

## 2024-10-27 DIAGNOSIS — Z87891 Personal history of nicotine dependence: Secondary | ICD-10-CM

## 2024-11-08 ENCOUNTER — Other Ambulatory Visit: Payer: Self-pay | Admitting: Family Medicine

## 2024-11-08 DIAGNOSIS — E291 Testicular hypofunction: Secondary | ICD-10-CM

## 2025-04-14 ENCOUNTER — Ambulatory Visit: Admitting: Family Medicine

## 2025-10-15 ENCOUNTER — Encounter: Admitting: Family Medicine
